# Patient Record
Sex: Male | Born: 1965 | Race: White | Hispanic: No | Marital: Married | Smoking: Never smoker
Health system: Southern US, Community
[De-identification: ages and names within clinical notes are randomized; demographics above are authoritative.]

## PROBLEM LIST (undated history)

## (undated) DIAGNOSIS — K219 Gastro-esophageal reflux disease without esophagitis: Secondary | ICD-10-CM

## (undated) DIAGNOSIS — E785 Hyperlipidemia, unspecified: Secondary | ICD-10-CM

## (undated) HISTORY — PX: KNEE ARTHROSCOPY: SUR90

---

## 2006-05-01 ENCOUNTER — Emergency Department (HOSPITAL_COMMUNITY): Admission: EM | Admit: 2006-05-01 | Discharge: 2006-05-01 | Payer: Self-pay | Admitting: Emergency Medicine

## 2014-05-18 ENCOUNTER — Other Ambulatory Visit: Payer: Self-pay | Admitting: Orthopedic Surgery

## 2014-05-18 DIAGNOSIS — M542 Cervicalgia: Secondary | ICD-10-CM

## 2014-05-18 DIAGNOSIS — M25511 Pain in right shoulder: Secondary | ICD-10-CM

## 2014-05-23 ENCOUNTER — Ambulatory Visit
Admission: RE | Admit: 2014-05-23 | Discharge: 2014-05-23 | Disposition: A | Discharge status: Home / Self Care | Payer: BC Managed Care – PPO | Source: Ambulatory Visit | Attending: Orthopedic Surgery | Admitting: Orthopedic Surgery

## 2014-05-23 DIAGNOSIS — M25511 Pain in right shoulder: Secondary | ICD-10-CM

## 2014-05-23 DIAGNOSIS — M542 Cervicalgia: Secondary | ICD-10-CM

## 2015-12-14 ENCOUNTER — Other Ambulatory Visit: Payer: Self-pay | Admitting: Cardiology

## 2015-12-14 ENCOUNTER — Encounter (HOSPITAL_COMMUNITY): Payer: Self-pay | Admitting: Cardiology

## 2015-12-14 ENCOUNTER — Encounter: Payer: Self-pay | Admitting: Cardiology

## 2015-12-14 DIAGNOSIS — IMO0001 Reserved for inherently not codable concepts without codable children: Secondary | ICD-10-CM | POA: Diagnosis present

## 2015-12-14 DIAGNOSIS — R030 Elevated blood-pressure reading, without diagnosis of hypertension: Secondary | ICD-10-CM

## 2015-12-14 DIAGNOSIS — I200 Unstable angina: Secondary | ICD-10-CM | POA: Diagnosis present

## 2015-12-14 DIAGNOSIS — R9430 Abnormal result of cardiovascular function study, unspecified: Secondary | ICD-10-CM

## 2015-12-14 DIAGNOSIS — E785 Hyperlipidemia, unspecified: Secondary | ICD-10-CM | POA: Diagnosis present

## 2015-12-14 NOTE — Progress Notes (Signed)
Patient ID: Adam Taylor, male   DOB: Jan 14, 1966, 50 y.o.   MRN: [id number]   Adam, Taylor    Date of visit:  12/14/2015 DOB:  07-11-1966    Age:  49 yrs. Medical record number:  [id number]     Account number:  [id number] ____________________________ CURRENT DIAGNOSES  1. Chest pain  2. Abnormal result of cardiovascular function study, unspecified  3. Hyperlipidemia  4. Elevated blood-pressure reading, without diagnosis of hypertension  5. Overweight ____________________________ ALLERGIES  No Known Allergies ____________________________ MEDICATIONS  1. omeprazole 20 mg capsule,delayed release, PRN  2. Cialis 5 mg tablet, PRN  3. nitroglycerin 0.4 mg sublingual tablet, Take as directed  4. aspirin 81 mg chewable tablet, 1 p.o. daily  5. atorvastatin 40 mg tablet, 1 p.o. daily  6. metoprolol succinate ER 50 mg tablet,extended release 24 hr, 1 p.o. daily ____________________________ CHIEF COMPLAINTS Chest pain ____________________________ HISTORY OF PRESENT ILLNESS This 50 year old male is seen as a new patient for evaluation of chest discomfort. About 2 weeks ago he was walking on a treadmill and had some midsternal burning type pain that started while he was running on the treadmill. He slowed down to a fast walking pace and was able to complete 40 minutes on the treadmill. 3 days ago while taking the trash cans down the street he had the onset of midsternal burning pain that intensified and he was able to come back up to the house and felt somewhat weak. He may have had some mild sweating with the discomfort. He had exertional burning type chest discomfort began this morning that again was associated with sweating and some shortness of breath and may have had a nagging sensation since then. He does not have any history of hyperlipidemia or family history of heart disease. He has been turned down for blood donation in the past because of elevated blood pressure. He was able to donate  blood 3 weeks ago without difficulty. He does not currently have a primary care physician. He does note some episodic indigestion and will use Prilosec OTC for this but has not had exertional type symptoms previously. He does have mild erectile dysfunction that he will use Cialis for intermittently. He has lost about 40 pounds in the past year. Does have some situational stress going on for the past 2 weeks.  Since he was seen he had recurrent chest discomfort yesterday while walking up a hill and also had chest discomfort while walking quickly to [place] on the 16th. He returned for a treadmill test later on the 16th and had 3 mm of ST segment depression that was flat and anterior leads at the end of stage I of the Bruce protocol. The test was terminated due to ST depression and he then began as some vague chest tightness that resolved quickly. He has not had any rest chest pain. After the treadmill test I talked about hospitalizing him but he did not wish to go into the hospital immediately that day. He was willing to have cardiac catheterization I discussed with him and made arrangements for him to have this done the next day. He was started on metoprolol and high-dose atorvastatin and was given instructions that he should go to emergency room. for recurrent pain and that he should not be involved in any activity. He has been on aspirin and was given nitroglycerin to take as needed when he was seen earlier in the week. ____________________________ PAST HISTORY  Past Medical Illnesses:  overweight, denies hypertension or  diabetes;  Cardiovascular Illnesses:  no previous history of cardiac disease.;  Surgical Procedures:  knee surgery, left;  NYHA Classification:  I;  Canadian Angina Classification:  Class 0: Asymptomatic;  Cardiology Procedures-Invasive:  no history of prior cardiac procedures;  Cardiology Procedures-Noninvasive:  no previous non-invasive procedures;  LVEF not documented,    ____________________________ CARDIO-PULMONARY TEST DATES EKG Date:  12/11/2015;   ____________________________ FAMILY HISTORY Brother -- Brother alive and well Brother -- Brother alive and well Father -- Father dead, Malignant melanoma, Prostate cancer Mother -- Mother alive and well ____________________________ SOCIAL HISTORY Alcohol Use:  socially;  Smoking:  never smoked;  Diet:  regular diet;  Lifestyle:  married;  Exercise:  some exercise;  Occupation:  Statistician;  Residence:  lives with wife;   ____________________________ REVIEW OF SYSTEMS General:  weight loss of approximately 40 lbs  Integumentary:no rashes or new skin lesions. Eyes: denies diplopia, history of glaucoma or visual problems. Respiratory: denies dyspnea, cough, wheezing or hemoptysis. Cardiovascular:  please review HPI Abdominal: dyspepsia Genitourinary-Male: no dysuria, urgency, frequency, or nocturia  Musculoskeletal:  denies arthritis, venous insufficiency, or muscle weakness Neurological:  denies headaches, stroke, or TIA Psychiatric:  situational stress  ____________________________ PHYSICAL EXAMINATION VITAL SIGNS  Blood Pressure:  140/88 Sitting, Left arm, regular cuff   Pulse:  96/min. Weight:  218.00 lbs. Height:  72"BMI: 29  Constitutional:  pleasant white male in no acute distress Skin:  warm and dry to touch, no apparent skin lesions, or masses noted. Head:  normocephalic, normal hair pattern, no masses or tenderness Eyes:  EOMS Intact, PERRLA, C and S clear, Funduscopic exam not done. ENT:  ears, nose and throat reveal no gross abnormalities.  Dentition good. Neck:  supple, without massess. No JVD, thyromegaly or carotid bruits. Carotid upstroke normal. Chest:  normal symmetry, clear to auscultation. Cardiac:  regular rhythm, normal S1 and S2, No S3 or S4, no murmurs, gallops or rubs detected. Abdomen:  abdomen soft,non-tender, no masses, no hepatospenomegaly, or aneurysm  noted Peripheral Pulses:  the femoral,dorsalis pedis, and posterior tibial pulses are full and equal bilaterally with no bruits auscultated. Extremities & Back:  no deformities, clubbing, cyanosis, erythema or edema observed. Normal muscle strength and tone. Neurological:  no gross motor or sensory deficits noted, affect appropriate, oriented x3. ____________________________ MOST RECENT LIPID PANEL 12/11/15  CHOL TOTL 241 mg/dL, TRIGLYCER 97 mg/dL, HDL 47 mg/dL, CHOL/HDL 5.1 Ratio, 629528 19 mg/dL and LDL 413 mg/dL ____________________________ IMPRESSIONS/PLAN  1. Chest pain consistent with new onset and progressive angina with early positive exercise treadmill test 2. Hyperlipidemia recently diagnosed 3. Elevated blood pressure without prior diagnosis of hypertension 4. Overweight  Recommendations:  He will be brought in for same-day catheterization. Cardiac catheterization was discussed with the patient including risks of myocardial infarction, death, stroke, bleeding, arrhythmia, dye allergy, or renal insufficiency. He understands and is willing to proceed. Possibility of percutaneous intervention at the same setting was also discussed with the patient including risks. Possibility of stenting was also discussed with him including being through the radial approach. This will be done by one of the physicians with [facility].  In the meantime he is started on metoprolol succinate 50 mg daily and is to begin atorvastatin 80 mg tonight and then 40 mg daily. Earlier in the week he was diagnosed with hyperlipidemia as noted below. ____________________________ Cardiology Physician:  Darden Palmer MD [facility]

## 2015-12-15 ENCOUNTER — Encounter (HOSPITAL_COMMUNITY)
Admission: AD | Disposition: A | Discharge status: Home / Self Care | Payer: Self-pay | Source: Ambulatory Visit | Attending: Cardiothoracic Surgery

## 2015-12-15 ENCOUNTER — Encounter (HOSPITAL_COMMUNITY): Payer: Self-pay | Admitting: Cardiology

## 2015-12-15 ENCOUNTER — Inpatient Hospital Stay (HOSPITAL_COMMUNITY): Payer: BC Managed Care – PPO

## 2015-12-15 ENCOUNTER — Inpatient Hospital Stay (HOSPITAL_COMMUNITY)
Admission: AD | Admit: 2015-12-15 | Discharge: 2015-12-20 | DRG: 234 | Disposition: A | Discharge status: Home / Self Care | Payer: BC Managed Care – PPO | Source: Ambulatory Visit | Attending: Cardiothoracic Surgery | Admitting: Cardiothoracic Surgery

## 2015-12-15 DIAGNOSIS — R9430 Abnormal result of cardiovascular function study, unspecified: Secondary | ICD-10-CM

## 2015-12-15 DIAGNOSIS — N529 Male erectile dysfunction, unspecified: Secondary | ICD-10-CM | POA: Diagnosis present

## 2015-12-15 DIAGNOSIS — Z8042 Family history of malignant neoplasm of prostate: Secondary | ICD-10-CM | POA: Diagnosis not present

## 2015-12-15 DIAGNOSIS — I25110 Atherosclerotic heart disease of native coronary artery with unstable angina pectoris: Principal | ICD-10-CM

## 2015-12-15 DIAGNOSIS — I2510 Atherosclerotic heart disease of native coronary artery without angina pectoris: Secondary | ICD-10-CM

## 2015-12-15 DIAGNOSIS — E663 Overweight: Secondary | ICD-10-CM | POA: Diagnosis present

## 2015-12-15 DIAGNOSIS — IMO0001 Reserved for inherently not codable concepts without codable children: Secondary | ICD-10-CM | POA: Diagnosis present

## 2015-12-15 DIAGNOSIS — I10 Essential (primary) hypertension: Secondary | ICD-10-CM | POA: Diagnosis present

## 2015-12-15 DIAGNOSIS — E785 Hyperlipidemia, unspecified: Secondary | ICD-10-CM | POA: Diagnosis not present

## 2015-12-15 DIAGNOSIS — Z808 Family history of malignant neoplasm of other organs or systems: Secondary | ICD-10-CM

## 2015-12-15 DIAGNOSIS — R079 Chest pain, unspecified: Secondary | ICD-10-CM

## 2015-12-15 DIAGNOSIS — Z6830 Body mass index (BMI) 30.0-30.9, adult: Secondary | ICD-10-CM

## 2015-12-15 DIAGNOSIS — I4510 Unspecified right bundle-branch block: Secondary | ICD-10-CM | POA: Diagnosis not present

## 2015-12-15 DIAGNOSIS — G4730 Sleep apnea, unspecified: Secondary | ICD-10-CM | POA: Diagnosis present

## 2015-12-15 DIAGNOSIS — E872 Acidosis: Secondary | ICD-10-CM | POA: Diagnosis not present

## 2015-12-15 DIAGNOSIS — Z951 Presence of aortocoronary bypass graft: Secondary | ICD-10-CM

## 2015-12-15 DIAGNOSIS — R030 Elevated blood-pressure reading, without diagnosis of hypertension: Secondary | ICD-10-CM

## 2015-12-15 DIAGNOSIS — I200 Unstable angina: Secondary | ICD-10-CM | POA: Diagnosis present

## 2015-12-15 HISTORY — DX: Gastro-esophageal reflux disease without esophagitis: K21.9

## 2015-12-15 HISTORY — PX: CARDIAC CATHETERIZATION: SHX172

## 2015-12-15 HISTORY — DX: Hyperlipidemia, unspecified: E78.5

## 2015-12-15 LAB — BASIC METABOLIC PANEL
Anion gap: 5 (ref 5–15)
BUN: 12 mg/dL (ref 6–20)
CO2: 27 mmol/L (ref 22–32)
Calcium: 9 mg/dL (ref 8.9–10.3)
Chloride: 108 mmol/L (ref 101–111)
Creatinine, Ser: 1.18 mg/dL (ref 0.61–1.24)
GFR calc Af Amer: >60 mL/min (ref >60)
GFR calc non Af Amer: >60 mL/min (ref >60)
Glucose, Bld: 90 mg/dL (ref 65–99)
Potassium: 4.2 mmol/L (ref 3.5–5.1)
Sodium: 140 mmol/L (ref 135–145)

## 2015-12-15 LAB — ABO/RH: ABO/RH(D): A POS

## 2015-12-15 LAB — POCT I-STAT 3, ART BLOOD GAS (G3+)
BICARBONATE: 24 meq/L (ref 20.0–24.0)
O2 SAT: 96 %
PCO2 ART: 37.3 mmHg (ref 35.0–45.0)
PO2 ART: 76 mmHg — AB (ref 80.0–100.0)
TCO2: 25 mmol/L (ref 0–100)
pH, Arterial: 7.415 (ref 7.350–7.450)

## 2015-12-15 LAB — CBC
HEMATOCRIT: 40.9 % (ref 39.0–52.0)
HEMOGLOBIN: 13.9 g/dL (ref 13.0–17.0)
MCH: 28 pg (ref 26.0–34.0)
MCHC: 34 g/dL (ref 30.0–36.0)
MCV: 82.3 fL (ref 78.0–100.0)
Platelets: 262 10*3/uL (ref 150–400)
RBC: 4.97 MIL/uL (ref 4.22–5.81)
RDW: 12.6 % (ref 11.5–15.5)
WBC: 6 10*3/uL (ref 4.0–10.5)

## 2015-12-15 LAB — PROTIME-INR
INR: 1.07 (ref 0.00–1.49)
PROTHROMBIN TIME: 14.1 s (ref 11.6–15.2)

## 2015-12-15 LAB — TYPE AND SCREEN
ABO/RH(D): A POS
Antibody Screen: NEGATIVE

## 2015-12-15 LAB — SURGICAL PCR SCREEN
MRSA, PCR: NEGATIVE
Staphylococcus aureus: NEGATIVE

## 2015-12-15 SURGERY — LEFT HEART CATH AND CORONARY ANGIOGRAPHY
Anesthesia: LOCAL

## 2015-12-15 MED ORDER — SODIUM CHLORIDE 0.9 % IV SOLN: 250.0000 mL | INTRAVENOUS | Status: DC | PRN

## 2015-12-15 MED ORDER — CHLORHEXIDINE GLUCONATE 0.12 % MT SOLN
15.0000 mL | Freq: Once | OROMUCOSAL | Status: AC
  Administered 2015-12-16: 15 mL via OROMUCOSAL
  Filled 2015-12-15: qty 15

## 2015-12-15 MED ORDER — SODIUM CHLORIDE 0.9 % IV SOLN
INTRAVENOUS | Status: DC
  Filled 2015-12-15: qty 2.5

## 2015-12-15 MED ORDER — NITROGLYCERIN IN D5W 200-5 MCG/ML-% IV SOLN
3.0000 ug/min | INTRAVENOUS | Status: DC
  Administered 2015-12-16: 5 ug/min via INTRAVENOUS

## 2015-12-15 MED ORDER — MIDAZOLAM HCL 2 MG/2ML IJ SOLN
INTRAMUSCULAR | Status: AC
  Filled 2015-12-15: qty 2

## 2015-12-15 MED ORDER — HEPARIN (PORCINE) IN NACL 100-0.45 UNIT/ML-% IJ SOLN
1200.0000 [IU]/h | INTRAMUSCULAR | Status: DC
  Administered 2015-12-16: 1200 [IU]/h via INTRAVENOUS
  Filled 2015-12-15 (×2): qty 250

## 2015-12-15 MED ORDER — SODIUM CHLORIDE 0.9% FLUSH: 3.0000 mL | INTRAVENOUS | Status: DC | PRN

## 2015-12-15 MED ORDER — DEXMEDETOMIDINE HCL IN NACL 400 MCG/100ML IV SOLN
0.1000 ug/kg/h | INTRAVENOUS | Status: DC
Start: 2015-12-16 — End: 2015-12-16
  Filled 2015-12-15: qty 100

## 2015-12-15 MED ORDER — IOHEXOL 350 MG/ML SOLN
INTRAVENOUS | Status: DC | PRN
  Administered 2015-12-15: 60 mL via INTRA_ARTERIAL

## 2015-12-15 MED ORDER — METOPROLOL TARTRATE 12.5 MG HALF TABLET
12.5000 mg | ORAL_TABLET | Freq: Once | ORAL | Status: AC
  Administered 2015-12-16: 12.5 mg via ORAL
  Filled 2015-12-15: qty 1

## 2015-12-15 MED ORDER — SODIUM CHLORIDE 0.9 % WEIGHT BASED INFUSION: 1.0000 mL/kg/h | INTRAVENOUS | Status: DC

## 2015-12-15 MED ORDER — CHLORHEXIDINE GLUCONATE 4 % EX LIQD
60.0000 mL | Freq: Once | CUTANEOUS | Status: AC
  Administered 2015-12-16: 4 via TOPICAL
  Filled 2015-12-15: qty 60

## 2015-12-15 MED ORDER — ACETAMINOPHEN 325 MG PO TABS: 650.0000 mg | ORAL_TABLET | ORAL | Status: DC | PRN

## 2015-12-15 MED ORDER — DOPAMINE-DEXTROSE 3.2-5 MG/ML-% IV SOLN
0.0000 ug/kg/min | INTRAVENOUS | Status: DC
  Filled 2015-12-15: qty 250

## 2015-12-15 MED ORDER — SODIUM CHLORIDE 0.9% FLUSH
3.0000 mL | Freq: Two times a day (BID) | INTRAVENOUS | Status: DC
  Administered 2015-12-15: 3 mL via INTRAVENOUS

## 2015-12-15 MED ORDER — HEPARIN SODIUM (PORCINE) 1000 UNIT/ML IJ SOLN
INTRAMUSCULAR | Status: DC | PRN
Start: 2015-12-15 — End: 2015-12-15
  Administered 2015-12-15: 5000 [IU] via INTRAVENOUS

## 2015-12-15 MED ORDER — FENTANYL CITRATE (PF) 100 MCG/2ML IJ SOLN
INTRAMUSCULAR | Status: AC
  Filled 2015-12-15: qty 2

## 2015-12-15 MED ORDER — SODIUM CHLORIDE 0.9% FLUSH: 3.0000 mL | Freq: Two times a day (BID) | INTRAVENOUS | Status: DC

## 2015-12-15 MED ORDER — LIDOCAINE HCL (PF) 1 % IJ SOLN
INTRAMUSCULAR | Status: DC | PRN
  Administered 2015-12-15: 14:00:00

## 2015-12-15 MED ORDER — SODIUM CHLORIDE 0.9 % IV SOLN
INTRAVENOUS | Status: DC
  Filled 2015-12-15: qty 40

## 2015-12-15 MED ORDER — ASPIRIN 81 MG PO CHEW
CHEWABLE_TABLET | ORAL | Status: AC
  Administered 2015-12-15: 81 mg via ORAL
  Filled 2015-12-15: qty 1

## 2015-12-15 MED ORDER — POTASSIUM CHLORIDE 2 MEQ/ML IV SOLN
80.0000 meq | INTRAVENOUS | Status: DC
  Filled 2015-12-15: qty 40

## 2015-12-15 MED ORDER — ONDANSETRON HCL 4 MG/2ML IJ SOLN: 4.0000 mg | Freq: Four times a day (QID) | INTRAMUSCULAR | Status: DC | PRN

## 2015-12-15 MED ORDER — DEXTROSE 5 % IV SOLN
30.0000 ug/min | INTRAVENOUS | Status: DC
  Filled 2015-12-15: qty 2

## 2015-12-15 MED ORDER — ASPIRIN 81 MG PO CHEW
81.0000 mg | CHEWABLE_TABLET | ORAL | Status: AC
  Administered 2015-12-15: 81 mg via ORAL

## 2015-12-15 MED ORDER — PLASMA-LYTE 148 IV SOLN
INTRAVENOUS | Status: DC
  Filled 2015-12-15: qty 2.5

## 2015-12-15 MED ORDER — DEXTROSE 5 % IV SOLN
750.0000 mg | INTRAVENOUS | Status: DC
  Filled 2015-12-15: qty 750

## 2015-12-15 MED ORDER — CHLORHEXIDINE GLUCONATE 4 % EX LIQD
60.0000 mL | Freq: Once | CUTANEOUS | Status: AC
  Administered 2015-12-15: 4 via TOPICAL
  Filled 2015-12-15: qty 60

## 2015-12-15 MED ORDER — MAGNESIUM SULFATE 50 % IJ SOLN
40.0000 meq | INTRAMUSCULAR | Status: DC
  Filled 2015-12-15: qty 10

## 2015-12-15 MED ORDER — SODIUM CHLORIDE 0.9 % IV SOLN
INTRAVENOUS | Status: DC
  Filled 2015-12-15: qty 30

## 2015-12-15 MED ORDER — HEPARIN (PORCINE) IN NACL 2-0.9 UNIT/ML-% IJ SOLN
INTRAMUSCULAR | Status: AC
  Filled 2015-12-15: qty 1000

## 2015-12-15 MED ORDER — HEPARIN (PORCINE) IN NACL 2-0.9 UNIT/ML-% IJ SOLN
INTRAMUSCULAR | Status: DC | PRN
  Administered 2015-12-15: 14:00:00 via INTRA_ARTERIAL

## 2015-12-15 MED ORDER — VERAPAMIL HCL 2.5 MG/ML IV SOLN
INTRAVENOUS | Status: AC
  Filled 2015-12-15: qty 2

## 2015-12-15 MED ORDER — SODIUM CHLORIDE 0.9 % WEIGHT BASED INFUSION
3.0000 mL/kg/h | INTRAVENOUS | Status: DC
  Administered 2015-12-15: 3 mL/kg/h via INTRAVENOUS

## 2015-12-15 MED ORDER — FENTANYL CITRATE (PF) 100 MCG/2ML IJ SOLN
INTRAMUSCULAR | Status: DC | PRN
  Administered 2015-12-15: 25 ug via INTRAVENOUS

## 2015-12-15 MED ORDER — NITROGLYCERIN IN D5W 200-5 MCG/ML-% IV SOLN
2.0000 ug/min | INTRAVENOUS | Status: DC
  Filled 2015-12-15: qty 250

## 2015-12-15 MED ORDER — VANCOMYCIN HCL 10 G IV SOLR
1500.0000 mg | INTRAVENOUS | Status: DC
  Filled 2015-12-15: qty 1500

## 2015-12-15 MED ORDER — METOPROLOL TARTRATE 25 MG PO TABS: 25.0000 mg | ORAL_TABLET | Freq: Two times a day (BID) | ORAL | Status: DC

## 2015-12-15 MED ORDER — LIDOCAINE HCL (PF) 1 % IJ SOLN
INTRAMUSCULAR | Status: AC
  Filled 2015-12-15: qty 30

## 2015-12-15 MED ORDER — EPINEPHRINE HCL 1 MG/ML IJ SOLN
0.0000 ug/min | INTRAMUSCULAR | Status: DC
  Filled 2015-12-15: qty 4

## 2015-12-15 MED ORDER — DIAZEPAM 5 MG PO TABS
5.0000 mg | ORAL_TABLET | Freq: Once | ORAL | Status: AC
  Administered 2015-12-16: 5 mg via ORAL
  Filled 2015-12-15: qty 1

## 2015-12-15 MED ORDER — TEMAZEPAM 15 MG PO CAPS: 15.0000 mg | ORAL_CAPSULE | Freq: Once | ORAL | Status: DC | PRN

## 2015-12-15 MED ORDER — DIAZEPAM 5 MG PO TABS: 5.0000 mg | ORAL_TABLET | ORAL | Status: DC | PRN

## 2015-12-15 MED ORDER — BISACODYL 5 MG PO TBEC: 5.0000 mg | DELAYED_RELEASE_TABLET | Freq: Once | ORAL | Status: DC

## 2015-12-15 MED ORDER — HEPARIN SODIUM (PORCINE) 1000 UNIT/ML IJ SOLN
INTRAMUSCULAR | Status: AC
  Filled 2015-12-15: qty 1

## 2015-12-15 MED ORDER — DEXTROSE 5 % IV SOLN
1.5000 g | INTRAVENOUS | Status: DC
  Filled 2015-12-15: qty 1.5

## 2015-12-15 MED ORDER — ATORVASTATIN CALCIUM 80 MG PO TABS
80.0000 mg | ORAL_TABLET | Freq: Every day | ORAL | Status: DC
  Administered 2015-12-15 – 2015-12-19 (×4): 80 mg via ORAL
  Filled 2015-12-15 (×4): qty 1

## 2015-12-15 MED ORDER — SODIUM CHLORIDE 0.9 % WEIGHT BASED INFUSION
1.0000 mL/kg/h | INTRAVENOUS | Status: AC
  Administered 2015-12-15: 1 mL/kg/h via INTRAVENOUS

## 2015-12-15 MED ORDER — MIDAZOLAM HCL 2 MG/2ML IJ SOLN
INTRAMUSCULAR | Status: DC | PRN
  Administered 2015-12-15: 2 mg via INTRAVENOUS

## 2015-12-15 SURGICAL SUPPLY — 9 items
CATH IMPULSE 5F ANG/FL3.5 (CATHETERS) ×1 IMPLANT
DEVICE RAD COMP TR BAND LRG (VASCULAR PRODUCTS) ×2 IMPLANT
GLIDESHEATH SLEND SS 6F .021 (SHEATH) ×2 IMPLANT
KIT HEART LEFT (KITS) ×2 IMPLANT
PACK CARDIAC CATHETERIZATION (CUSTOM PROCEDURE TRAY) ×2 IMPLANT
SYR MEDRAD MARK V 150ML (SYRINGE) ×2 IMPLANT
TRANSDUCER W/STOPCOCK (MISCELLANEOUS) ×2 IMPLANT
TUBING CIL FLEX 10 FLL-RA (TUBING) ×2 IMPLANT
WIRE SAFE-T 1.5MM-J .035X260CM (WIRE) ×2 IMPLANT

## 2015-12-15 NOTE — Progress Notes (Signed)
VASCULAR LAB PRELIMINARY  PRELIMINARY  PRELIMINARY  PRELIMINARY  Pre-op Cardiac Surgery  Carotid Findings:  Bilateral:  1-39% ICA stenosis.  Vertebral artery flow is antegrade.      Upper Extremity Right Left  Brachial Pressures 125  Triphasic  119  Triphasic   Radial Waveforms Triphasic  Triphasic   Ulnar Waveforms Triphasic  Triphasic   Palmar Arch (Allen's Test) Doppler obliterates with radial compression, normal with ulnar compression. Doppler normal with radial compression, obliterates with ulnar compression.     Lower  Extremity Right Left  Dorsalis Pedis Triphasic  Triphasic   Anterior Tibial    Posterior Tibial Triphasic  Triphasic   Ankle/Brachial Indices      Adam Taylor, RVT 12/15/2015, 8:06 PM

## 2015-12-15 NOTE — Progress Notes (Signed)
ANTICOAGULATION CONSULT NOTE - Initial Consult  Pharmacy Consult for heparin Indication: chest pain/ACS  No Known Allergies  Patient Measurements: Height: 6' (182.9 cm) Weight: 208 lb (94.348 kg) IBW/kg (Calculated) : 77.6 Heparin Dosing Weight: 94 kg  Vital Signs: Temp: 98.4 F (36.9 C) (02/17 1158) Temp Source: Oral (02/17 1158) BP: 153/89 mmHg (02/17 1412) Pulse Rate: 68 (02/17 1412)  Labs:  Recent Labs  12/15/15 1250  LABPROT 14.1  INR 1.07    CrCl cannot be calculated (Patient has no serum creatinine result on file.).   Medical History: Past Medical History  Diagnosis Date  . Hyperlipidemia   . GERD without esophagitis     Medications:  Prescriptions prior to admission  Medication Sig Dispense Refill Last Dose  . omeprazole (PRILOSEC) 20 MG capsule Take 20 mg by mouth daily as needed.   12/15/2015 at 0900  . nitroGLYCERIN (NITROSTAT) 0.4 MG SL tablet Place 0.4 mg under the tongue every 5 (five) minutes as needed for chest pain.    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    Goal of Therapy:  Heparin level 0.3-0.7 units/ml Monitor platelets by anticoagulation protocol: Yes   Plan:  -Heparin 1200 units/hr -Daily HL, CBC -Monitor s/sx bleeding    Baldemar Friday 12/15/2015,4:07 PM

## 2015-12-15 NOTE — Progress Notes (Signed)
Day of Surgery Procedure(s) (LRB): Left Heart Cath and Coronary Angiography (N/A) Subjective: Patient examined,cardiac cath reviewed Healthy 50 yo male with exertional angina, positive ETT Cath with distal L main 90%, RCA normal  no pain during cath  Objective: Vital signs in last 24 hours: Temp:  [98.4 F (36.9 C)] 98.4 F (36.9 C) (02/17 1158) Pulse Rate:  [59-126] 68 (02/17 1412) Cardiac Rhythm:  [-]  Resp:  [11-40] 15 (02/17 1412) BP: (128-156)/(75-94) 153/89 mmHg (02/17 1412) SpO2:  [97 %-100 %] 99 % (02/17 1412) Weight:  [208 lb (94.348 kg)] 208 lb (94.348 kg) (02/17 1158)  Hemodynamic parameters for last 24 hours:  nsr  Intake/Output from previous day:   Intake/Output this shift:        Physical Exam  General: healthy middle aged WM NAD HEENT: Normocephalic pupils equal , dentition adequate Neck: Supple without JVD, adenopathy, or bruit Chest: Clear to auscultation, symmetrical breath sounds, no rhonchi, no tenderness             or deformity Cardiovascular: Regular rate and rhythm, no murmur, no gallop, peripheral pulses             palpable in all extremities Abdomen:  Soft, nontender, no palpable mass or organomegaly Extremities: Warm, well-perfused, no clubbing cyanosis edema or tenderness, R radial compression device in place              no venous stasis changes of the legs Rectal/GU: Deferred Neuro: Grossly non--focal and symmetrical throughout Skin: Clean and dry without rash or ulceration   Lab Results: No results for input(s): WBC, HGB, HCT, PLT in the last 72 hours. BMET: No results for input(s): NA, K, CL, CO2, GLUCOSE, BUN, CREATININE, CALCIUM in the last 72 hours.  PT/INR:  Recent Labs  12/15/15 1250  LABPROT 14.1  INR 1.07   ABG No results found for: PHART, HCO3, TCO2, ACIDBASEDEF, O2SAT CBG (last 3)  No results for input(s): GLUCAP in the last 72 hours.  Assessment/Plan: S/P Procedure(s) (LRB): Left Heart Cath and Coronary  Angiography (N/A)  Plan CABG in am Procedure, benefits and risks d/w patient and wife   LOS: 0 days    Kathlee Nations Trigt III 12/15/2015

## 2015-12-15 NOTE — H&P (View-Only) (Signed)
Patient ID: Adam Taylor, male   DOB: Jan 14, 1966, 50 y.o.   MRN: [id number]   Adam, Taylor    Date of visit:  12/14/2015 DOB:  07-11-1966    Age:  49 yrs. Medical record number:  [id number]     Account number:  [id number] ____________________________ CURRENT DIAGNOSES  1. Chest pain  2. Abnormal result of cardiovascular function study, unspecified  3. Hyperlipidemia  4. Elevated blood-pressure reading, without diagnosis of hypertension  5. Overweight ____________________________ ALLERGIES  No Known Allergies ____________________________ MEDICATIONS  1. omeprazole 20 mg capsule,delayed release, PRN  2. Cialis 5 mg tablet, PRN  3. nitroglycerin 0.4 mg sublingual tablet, Take as directed  4. aspirin 81 mg chewable tablet, 1 p.o. daily  5. atorvastatin 40 mg tablet, 1 p.o. daily  6. metoprolol succinate ER 50 mg tablet,extended release 24 hr, 1 p.o. daily ____________________________ CHIEF COMPLAINTS Chest pain ____________________________ HISTORY OF PRESENT ILLNESS This 50 year old male is seen as a new patient for evaluation of chest discomfort. About 2 weeks ago he was walking on a treadmill and had some midsternal burning type pain that started while he was running on the treadmill. He slowed down to a fast walking pace and was able to complete 40 minutes on the treadmill. 3 days ago while taking the trash cans down the street he had the onset of midsternal burning pain that intensified and he was able to come back up to the house and felt somewhat weak. He may have had some mild sweating with the discomfort. He had exertional burning type chest discomfort began this morning that again was associated with sweating and some shortness of breath and may have had a nagging sensation since then. He does not have any history of hyperlipidemia or family history of heart disease. He has been turned down for blood donation in the past because of elevated blood pressure. He was able to donate  blood 3 weeks ago without difficulty. He does not currently have a primary care physician. He does note some episodic indigestion and will use Prilosec OTC for this but has not had exertional type symptoms previously. He does have mild erectile dysfunction that he will use Cialis for intermittently. He has lost about 40 pounds in the past year. Does have some situational stress going on for the past 2 weeks.  Since he was seen he had recurrent chest discomfort yesterday while walking up a hill and also had chest discomfort while walking quickly to [place] on the 16th. He returned for a treadmill test later on the 16th and had 3 mm of ST segment depression that was flat and anterior leads at the end of stage I of the Bruce protocol. The test was terminated due to ST depression and he then began as some vague chest tightness that resolved quickly. He has not had any rest chest pain. After the treadmill test I talked about hospitalizing him but he did not wish to go into the hospital immediately that day. He was willing to have cardiac catheterization I discussed with him and made arrangements for him to have this done the next day. He was started on metoprolol and high-dose atorvastatin and was given instructions that he should go to emergency room. for recurrent pain and that he should not be involved in any activity. He has been on aspirin and was given nitroglycerin to take as needed when he was seen earlier in the week. ____________________________ PAST HISTORY  Past Medical Illnesses:  overweight, denies hypertension or  diabetes;  Cardiovascular Illnesses:  no previous history of cardiac disease.;  Surgical Procedures:  knee surgery, left;  NYHA Classification:  I;  Canadian Angina Classification:  Class 0: Asymptomatic;  Cardiology Procedures-Invasive:  no history of prior cardiac procedures;  Cardiology Procedures-Noninvasive:  no previous non-invasive procedures;  LVEF not documented,    ____________________________ CARDIO-PULMONARY TEST DATES EKG Date:  12/11/2015;   ____________________________ FAMILY HISTORY Brother -- Brother alive and well Brother -- Brother alive and well Father -- Father dead, Malignant melanoma, Prostate cancer Mother -- Mother alive and well ____________________________ SOCIAL HISTORY Alcohol Use:  socially;  Smoking:  never smoked;  Diet:  regular diet;  Lifestyle:  married;  Exercise:  some exercise;  Occupation:  owner construction company;  Residence:  lives with wife;   ____________________________ REVIEW OF SYSTEMS General:  weight loss of approximately 40 lbs  Integumentary:no rashes or new skin lesions. Eyes: denies diplopia, history of glaucoma or visual problems. Respiratory: denies dyspnea, cough, wheezing or hemoptysis. Cardiovascular:  please review HPI Abdominal: dyspepsia Genitourinary-Male: no dysuria, urgency, frequency, or nocturia  Musculoskeletal:  denies arthritis, venous insufficiency, or muscle weakness Neurological:  denies headaches, stroke, or TIA Psychiatric:  situational stress  ____________________________ PHYSICAL EXAMINATION VITAL SIGNS  Blood Pressure:  140/88 Sitting, Left arm, regular cuff   Pulse:  96/min. Weight:  218.00 lbs. Height:  72"BMI: 29  Constitutional:  pleasant white male in no acute distress Skin:  warm and dry to touch, no apparent skin lesions, or masses noted. Head:  normocephalic, normal hair pattern, no masses or tenderness Eyes:  EOMS Intact, PERRLA, C and S clear, Funduscopic exam not done. ENT:  ears, nose and throat reveal no gross abnormalities.  Dentition good. Neck:  supple, without massess. No JVD, thyromegaly or carotid bruits. Carotid upstroke normal. Chest:  normal symmetry, clear to auscultation. Cardiac:  regular rhythm, normal S1 and S2, No S3 or S4, no murmurs, gallops or rubs detected. Abdomen:  abdomen soft,non-tender, no masses, no hepatospenomegaly, or aneurysm  noted Peripheral Pulses:  the femoral,dorsalis pedis, and posterior tibial pulses are full and equal bilaterally with no bruits auscultated. Extremities & Back:  no deformities, clubbing, cyanosis, erythema or edema observed. Normal muscle strength and tone. Neurological:  no gross motor or sensory deficits noted, affect appropriate, oriented x3. ____________________________ MOST RECENT LIPID PANEL 12/11/15  CHOL TOTL 241 mg/dL, TRIGLYCER 97 mg/dL, HDL 47 mg/dL, CHOL/HDL 5.1 Ratio, 230315 19 mg/dL and LDL 175 mg/dL ____________________________ IMPRESSIONS/PLAN  1. Chest pain consistent with new onset and progressive angina with early positive exercise treadmill test 2. Hyperlipidemia recently diagnosed 3. Elevated blood pressure without prior diagnosis of hypertension 4. Overweight  Recommendations:  He will be brought in for same-day catheterization. Cardiac catheterization was discussed with the patient including risks of myocardial infarction, death, stroke, bleeding, arrhythmia, dye allergy, or renal insufficiency. He understands and is willing to proceed. Possibility of percutaneous intervention at the same setting was also discussed with the patient including risks. Possibility of stenting was also discussed with him including being through the radial approach. This will be done by one of the physicians with [facility].  In the meantime he is started on metoprolol succinate 50 mg daily and is to begin atorvastatin 80 mg tonight and then 40 mg daily. Earlier in the week he was diagnosed with hyperlipidemia as noted below. ____________________________ Cardiology Physician:  W. Spencer Tilley, Jr. MD [facility]    

## 2015-12-15 NOTE — Progress Notes (Signed)
ABG collected on ROOM AIR. Pre-cabg abg

## 2015-12-15 NOTE — Progress Notes (Signed)
Utilization Review Completed.Dorcas Carrow T2/17/2017

## 2015-12-15 NOTE — Progress Notes (Signed)
Patient was brought in for same-day cardiac catheterization.  He was seen in the office earlier this week with new onset of exertional angina and had a very strongly positive exercise treadmill test which is brought over into the chart with 3 mm of ST depression in the anterior and inferior leads into Bruce stage I.  Catheterization films were reviewed and show severe 99% ostial left main disease.  The right coronary artery has scattered irregularities.  He is currently pain-free.  Bypass grafting is planned for the morning.  He is on beta blockers and was started on atorvastatin yesterday.  Questions were answered regarding the surgery.  Appreciate help of cardiac surgery as well as Dr. [place].  Darden Palmer MD [facility]

## 2015-12-15 NOTE — Interval H&P Note (Signed)
History and Physical Interval Note:  12/15/2015 1:33 PM  Adam Taylor  has presented today for surgery, with the diagnosis of unstable angina  The various methods of treatment have been discussed with the patient and family. After consideration of risks, benefits and other options for treatment, the patient has consented to  Procedure(s): Left Heart Cath and Coronary Angiography (N/A) as a surgical intervention .  The patient's history has been reviewed, patient examined, no change in status, stable for surgery.  I have reviewed the patient's chart and labs.  Questions were answered to the patient's satisfaction.   Cath Lab Visit (complete for each Cath Lab visit)  Clinical Evaluation Leading to the Procedure:   ACS: No.  Non-ACS:    Anginal Classification: CCS III  Anti-ischemic medical therapy: No Therapy  Non-Invasive Test Results: High-risk stress test findings: cardiac mortality >3%/year  Prior CABG: No previous CABG        Theron Arista [facility] 12/15/2015 1:33 PM

## 2015-12-16 ENCOUNTER — Inpatient Hospital Stay (HOSPITAL_COMMUNITY): Payer: BC Managed Care – PPO | Admitting: Certified Registered Nurse Anesthetist

## 2015-12-16 ENCOUNTER — Inpatient Hospital Stay (HOSPITAL_COMMUNITY): Payer: BC Managed Care – PPO

## 2015-12-16 ENCOUNTER — Encounter (HOSPITAL_COMMUNITY)
Admission: AD | Disposition: A | Discharge status: Home / Self Care | Payer: Self-pay | Source: Ambulatory Visit | Attending: Cardiothoracic Surgery

## 2015-12-16 DIAGNOSIS — Z951 Presence of aortocoronary bypass graft: Secondary | ICD-10-CM

## 2015-12-16 HISTORY — PX: INTRAOPERATIVE TRANSESOPHAGEAL ECHOCARDIOGRAM: SHX5062

## 2015-12-16 HISTORY — PX: CORONARY ARTERY BYPASS GRAFT: SHX141

## 2015-12-16 LAB — CREATININE, SERUM
Creatinine, Ser: 1.06 mg/dL (ref 0.61–1.24)
GFR calc Af Amer: >60 mL/min (ref >60)
GFR calc non Af Amer: >60 mL/min (ref >60)

## 2015-12-16 LAB — POCT I-STAT, CHEM 8
BUN: 11 mg/dL (ref 6–20)
BUN: 9 mg/dL (ref 6–20)
BUN: 9 mg/dL (ref 6–20)
BUN: 9 mg/dL (ref 6–20)
BUN: 9 mg/dL (ref 6–20)
BUN: 8 mg/dL (ref 6–20)
CALCIUM ION: 1.19 mmol/L (ref 1.12–1.23)
CALCIUM ION: 1.18 mmol/L (ref 1.12–1.23)
CALCIUM ION: 0.99 mmol/L — AB (ref 1.12–1.23)
CALCIUM ION: 0.99 mmol/L — AB (ref 1.12–1.23)
CALCIUM ION: 1.05 mmol/L — AB (ref 1.12–1.23)
CHLORIDE: 104 mmol/L (ref 101–111)
CHLORIDE: 108 mmol/L (ref 101–111)
CREATININE: 0.7 mg/dL (ref 0.61–1.24)
CREATININE: 0.7 mg/dL (ref 0.61–1.24)
CREATININE: 0.9 mg/dL (ref 0.61–1.24)
CREATININE: 0.8 mg/dL (ref 0.61–1.24)
Calcium, Ion: 1.02 mmol/L — ABNORMAL LOW (ref 1.12–1.23)
Chloride: 104 mmol/L (ref 101–111)
Chloride: 99 mmol/L — ABNORMAL LOW (ref 101–111)
Chloride: 100 mmol/L — ABNORMAL LOW (ref 101–111)
Chloride: 100 mmol/L — ABNORMAL LOW (ref 101–111)
Creatinine, Ser: 0.8 mg/dL (ref 0.61–1.24)
Creatinine, Ser: 0.9 mg/dL (ref 0.61–1.24)
GLUCOSE: 89 mg/dL (ref 65–99)
GLUCOSE: 104 mg/dL — AB (ref 65–99)
GLUCOSE: 148 mg/dL — AB (ref 65–99)
GLUCOSE: 156 mg/dL — AB (ref 65–99)
Glucose, Bld: 91 mg/dL (ref 65–99)
Glucose, Bld: 94 mg/dL (ref 65–99)
HCT: 36 % — ABNORMAL LOW (ref 39.0–52.0)
HCT: 26 % — ABNORMAL LOW (ref 39.0–52.0)
HCT: 26 % — ABNORMAL LOW (ref 39.0–52.0)
HCT: 27 % — ABNORMAL LOW (ref 39.0–52.0)
HEMATOCRIT: 34 % — AB (ref 39.0–52.0)
HEMATOCRIT: 26 % — AB (ref 39.0–52.0)
HEMOGLOBIN: 8.8 g/dL — AB (ref 13.0–17.0)
HEMOGLOBIN: 8.8 g/dL — AB (ref 13.0–17.0)
HEMOGLOBIN: 8.8 g/dL — AB (ref 13.0–17.0)
HEMOGLOBIN: 9.2 g/dL — AB (ref 13.0–17.0)
Hemoglobin: 12.2 g/dL — ABNORMAL LOW (ref 13.0–17.0)
Hemoglobin: 11.6 g/dL — ABNORMAL LOW (ref 13.0–17.0)
POTASSIUM: 4 mmol/L (ref 3.5–5.1)
POTASSIUM: 4.7 mmol/L (ref 3.5–5.1)
Potassium: 4 mmol/L (ref 3.5–5.1)
Potassium: 4.2 mmol/L (ref 3.5–5.1)
Potassium: 4.8 mmol/L (ref 3.5–5.1)
Potassium: 4 mmol/L (ref 3.5–5.1)
SODIUM: 139 mmol/L (ref 135–145)
SODIUM: 135 mmol/L (ref 135–145)
Sodium: 136 mmol/L (ref 135–145)
Sodium: 139 mmol/L (ref 135–145)
Sodium: 136 mmol/L (ref 135–145)
Sodium: 135 mmol/L (ref 135–145)
TCO2: 27 mmol/L (ref 0–100)
TCO2: 25 mmol/L (ref 0–100)
TCO2: 26 mmol/L (ref 0–100)
TCO2: 26 mmol/L (ref 0–100)
TCO2: 25 mmol/L (ref 0–100)
TCO2: 24 mmol/L (ref 0–100)

## 2015-12-16 LAB — BASIC METABOLIC PANEL
Anion gap: 9 (ref 5–15)
BUN: 12 mg/dL (ref 6–20)
CO2: 21 mmol/L — ABNORMAL LOW (ref 22–32)
Calcium: 8.8 mg/dL — ABNORMAL LOW (ref 8.9–10.3)
Chloride: 108 mmol/L (ref 101–111)
Creatinine, Ser: 1.15 mg/dL (ref 0.61–1.24)
GFR calc Af Amer: >60 mL/min (ref >60)
GFR calc non Af Amer: >60 mL/min (ref >60)
Glucose, Bld: 86 mg/dL (ref 65–99)
Potassium: 4.1 mmol/L (ref 3.5–5.1)
Sodium: 138 mmol/L (ref 135–145)

## 2015-12-16 LAB — POCT I-STAT 3, ART BLOOD GAS (G3+)
ACID-BASE DEFICIT: 2 mmol/L (ref 0.0–2.0)
ACID-BASE EXCESS: 1 mmol/L (ref 0.0–2.0)
Acid-base deficit: 3 mmol/L — ABNORMAL HIGH (ref 0.0–2.0)
Acid-base deficit: 5 mmol/L — ABNORMAL HIGH (ref 0.0–2.0)
Acid-base deficit: 5 mmol/L — ABNORMAL HIGH (ref 0.0–2.0)
BICARBONATE: 23.2 meq/L (ref 20.0–24.0)
Bicarbonate: 25 mEq/L — ABNORMAL HIGH (ref 20.0–24.0)
Bicarbonate: 24.5 mEq/L — ABNORMAL HIGH (ref 20.0–24.0)
Bicarbonate: 19.6 mEq/L — ABNORMAL LOW (ref 20.0–24.0)
Bicarbonate: 22.2 meq/L (ref 20.0–24.0)
O2 SAT: 100 %
O2 Saturation: 100 %
O2 Saturation: 98 %
O2 Saturation: 99 %
O2 Saturation: 90 %
PCO2 ART: 54.6 mmHg — AB (ref 35.0–45.0)
PH ART: 7.425 (ref 7.350–7.450)
PH ART: 7.402 (ref 7.350–7.450)
Patient temperature: 35.7
TCO2: 24 mmol/L (ref 0–100)
TCO2: 26 mmol/L (ref 0–100)
TCO2: 26 mmol/L (ref 0–100)
TCO2: 21 mmol/L (ref 0–100)
TCO2: 24 mmol/L (ref 0–100)
pCO2 arterial: 39.9 mmHg (ref 35.0–45.0)
pCO2 arterial: 38.1 mmHg (ref 35.0–45.0)
pCO2 arterial: 31.1 mmHg — ABNORMAL LOW (ref 35.0–45.0)
pCO2 arterial: 46.2 mmHg — ABNORMAL HIGH (ref 35.0–45.0)
pH, Arterial: 7.372 (ref 7.350–7.450)
pH, Arterial: 7.253 — ABNORMAL LOW (ref 7.350–7.450)
pH, Arterial: 7.283 — ABNORMAL LOW (ref 7.350–7.450)
pO2, Arterial: 278 mmHg — ABNORMAL HIGH (ref 80.0–100.0)
pO2, Arterial: 259 mmHg — ABNORMAL HIGH (ref 80.0–100.0)
pO2, Arterial: 125 mmHg — ABNORMAL HIGH (ref 80.0–100.0)
pO2, Arterial: 114 mmHg — ABNORMAL HIGH (ref 80.0–100.0)
pO2, Arterial: 61 mmHg — ABNORMAL LOW (ref 80.0–100.0)

## 2015-12-16 LAB — URINALYSIS, ROUTINE W REFLEX MICROSCOPIC
Bilirubin Urine: NEGATIVE
Glucose, UA: NEGATIVE mg/dL
Hgb urine dipstick: NEGATIVE
Ketones, ur: NEGATIVE mg/dL
Leukocytes, UA: NEGATIVE
Nitrite: NEGATIVE
Protein, ur: NEGATIVE mg/dL
Specific Gravity, Urine: 1.022 (ref 1.005–1.030)
pH: 5.5 (ref 5.0–8.0)

## 2015-12-16 LAB — CBC
HCT: 40.4 % (ref 39.0–52.0)
HCT: 30.3 % — ABNORMAL LOW (ref 39.0–52.0)
HEMATOCRIT: 33.7 % — AB (ref 39.0–52.0)
HEMOGLOBIN: 11.3 g/dL — AB (ref 13.0–17.0)
Hemoglobin: 13.7 g/dL (ref 13.0–17.0)
Hemoglobin: 10.2 g/dL — ABNORMAL LOW (ref 13.0–17.0)
MCH: 28 pg (ref 26.0–34.0)
MCH: 28 pg (ref 26.0–34.0)
MCH: 27.9 pg (ref 26.0–34.0)
MCHC: 33.9 g/dL (ref 30.0–36.0)
MCHC: 33.5 g/dL (ref 30.0–36.0)
MCHC: 33.7 g/dL (ref 30.0–36.0)
MCV: 82.6 fL (ref 78.0–100.0)
MCV: 83.6 fL (ref 78.0–100.0)
MCV: 82.8 fL (ref 78.0–100.0)
Platelets: 241 10*3/uL (ref 150–400)
Platelets: 145 10*3/uL — ABNORMAL LOW (ref 150–400)
Platelets: 154 10*3/uL (ref 150–400)
RBC: 4.89 MIL/uL (ref 4.22–5.81)
RBC: 4.03 MIL/uL — ABNORMAL LOW (ref 4.22–5.81)
RBC: 3.66 MIL/uL — ABNORMAL LOW (ref 4.22–5.81)
RDW: 12.7 % (ref 11.5–15.5)
RDW: 12.8 % (ref 11.5–15.5)
RDW: 12.7 % (ref 11.5–15.5)
WBC: 6.1 10*3/uL (ref 4.0–10.5)
WBC: 15.8 10*3/uL — AB (ref 4.0–10.5)
WBC: 12.6 10*3/uL — ABNORMAL HIGH (ref 4.0–10.5)

## 2015-12-16 LAB — GLUCOSE, CAPILLARY
GLUCOSE-CAPILLARY: 87 mg/dL (ref 65–99)
GLUCOSE-CAPILLARY: 115 mg/dL — AB (ref 65–99)
Glucose-Capillary: 124 mg/dL — ABNORMAL HIGH (ref 65–99)
Glucose-Capillary: 131 mg/dL — ABNORMAL HIGH (ref 65–99)
Glucose-Capillary: 134 mg/dL — ABNORMAL HIGH (ref 65–99)
Glucose-Capillary: 154 mg/dL — ABNORMAL HIGH (ref 65–99)
Glucose-Capillary: 152 mg/dL — ABNORMAL HIGH (ref 65–99)
Glucose-Capillary: 135 mg/dL — ABNORMAL HIGH (ref 65–99)

## 2015-12-16 LAB — HEMOGLOBIN AND HEMATOCRIT, BLOOD
HCT: 26.9 % — ABNORMAL LOW (ref 39.0–52.0)
Hemoglobin: 9.2 g/dL — ABNORMAL LOW (ref 13.0–17.0)

## 2015-12-16 LAB — PROTIME-INR
INR: 1.78 — AB (ref 0.00–1.49)
Prothrombin Time: 20.7 seconds — ABNORMAL HIGH (ref 11.6–15.2)

## 2015-12-16 LAB — MAGNESIUM: Magnesium: 2.8 mg/dL — ABNORMAL HIGH (ref 1.7–2.4)

## 2015-12-16 LAB — APTT
APTT: 30 s (ref 24–37)
aPTT: 33 seconds (ref 24–37)

## 2015-12-16 LAB — PLATELET COUNT: Platelets: 149 10*3/uL — ABNORMAL LOW (ref 150–400)

## 2015-12-16 SURGERY — CORONARY ARTERY BYPASS GRAFTING (CABG)
Anesthesia: General | Site: Chest

## 2015-12-16 MED ORDER — MAGNESIUM SULFATE 4 GM/100ML IV SOLN
4.0000 g | Freq: Once | INTRAVENOUS | Status: AC
  Administered 2015-12-16: 4 g via INTRAVENOUS
  Filled 2015-12-16: qty 100

## 2015-12-16 MED ORDER — CHLORHEXIDINE GLUCONATE 0.12 % MT SOLN: 15.0000 mL | OROMUCOSAL | Status: AC

## 2015-12-16 MED ORDER — ACETAMINOPHEN 160 MG/5ML PO SOLN: 650.0000 mg | Freq: Once | ORAL | Status: AC

## 2015-12-16 MED ORDER — MIDAZOLAM HCL 2 MG/2ML IJ SOLN
INTRAMUSCULAR | Status: AC
  Filled 2015-12-16: qty 2

## 2015-12-16 MED ORDER — METOPROLOL TARTRATE 25 MG/10 ML ORAL SUSPENSION: 12.5000 mg | Freq: Two times a day (BID) | ORAL | Status: DC

## 2015-12-16 MED ORDER — OXYCODONE HCL 5 MG PO TABS
5.0000 mg | ORAL_TABLET | ORAL | Status: DC | PRN
  Administered 2015-12-17 – 2015-12-19 (×5): 5 mg via ORAL
  Filled 2015-12-16 (×5): qty 1

## 2015-12-16 MED ORDER — LACTATED RINGERS IV SOLN
INTRAVENOUS | Status: DC | PRN
  Administered 2015-12-16 (×2): via INTRAVENOUS

## 2015-12-16 MED ORDER — HEMOSTATIC AGENTS (NO CHARGE) OPTIME
TOPICAL | Status: DC | PRN
  Administered 2015-12-16 (×2): 1 via TOPICAL

## 2015-12-16 MED ORDER — ROCURONIUM BROMIDE 50 MG/5ML IV SOLN
INTRAVENOUS | Status: AC
  Filled 2015-12-16: qty 2

## 2015-12-16 MED ORDER — LACTATED RINGERS IV SOLN
INTRAVENOUS | Status: DC | PRN
  Administered 2015-12-16 (×2): via INTRAVENOUS

## 2015-12-16 MED ORDER — FENTANYL CITRATE (PF) 250 MCG/5ML IJ SOLN
INTRAMUSCULAR | Status: DC | PRN
  Administered 2015-12-16: 50 ug via INTRAVENOUS
  Administered 2015-12-16: 150 ug via INTRAVENOUS
  Administered 2015-12-16: 50 ug via INTRAVENOUS
  Administered 2015-12-16: 150 ug via INTRAVENOUS
  Administered 2015-12-16: 50 ug via INTRAVENOUS
  Administered 2015-12-16: 150 ug via INTRAVENOUS
  Administered 2015-12-16: 250 ug via INTRAVENOUS
  Administered 2015-12-16: 100 ug via INTRAVENOUS
  Administered 2015-12-16: 200 ug via INTRAVENOUS
  Administered 2015-12-16: 100 ug via INTRAVENOUS

## 2015-12-16 MED ORDER — SODIUM CHLORIDE 0.9 % IJ SOLN
OROMUCOSAL | Status: DC | PRN
  Administered 2015-12-16 (×3): 4 mL via TOPICAL

## 2015-12-16 MED ORDER — MIDAZOLAM HCL 10 MG/2ML IJ SOLN
INTRAMUSCULAR | Status: AC
Start: 2015-12-16 — End: 2015-12-16
  Filled 2015-12-16: qty 2

## 2015-12-16 MED ORDER — ROCURONIUM BROMIDE 100 MG/10ML IV SOLN
INTRAVENOUS | Status: DC | PRN
  Administered 2015-12-16: 30 mg via INTRAVENOUS
  Administered 2015-12-16: 70 mg via INTRAVENOUS
  Administered 2015-12-16: 25 mg via INTRAVENOUS

## 2015-12-16 MED ORDER — MIDAZOLAM HCL 5 MG/5ML IJ SOLN
INTRAMUSCULAR | Status: DC | PRN
  Administered 2015-12-16: 2 mg via INTRAVENOUS
  Administered 2015-12-16: 1 mg via INTRAVENOUS
  Administered 2015-12-16 (×2): 2 mg via INTRAVENOUS
  Administered 2015-12-16: 5 mg via INTRAVENOUS

## 2015-12-16 MED ORDER — LIDOCAINE HCL (CARDIAC) 20 MG/ML IV SOLN
INTRAVENOUS | Status: DC | PRN
  Administered 2015-12-16: 70 mg via INTRATRACHEAL

## 2015-12-16 MED ORDER — SODIUM CHLORIDE 0.9 % IV SOLN
INTRAVENOUS | Status: DC | PRN
  Administered 2015-12-16 (×2): via INTRAVENOUS

## 2015-12-16 MED ORDER — 0.9 % SODIUM CHLORIDE (POUR BTL) OPTIME
TOPICAL | Status: DC | PRN
  Administered 2015-12-16: 5000 mL

## 2015-12-16 MED ORDER — SODIUM CHLORIDE 0.9% FLUSH
3.0000 mL | Freq: Two times a day (BID) | INTRAVENOUS | Status: DC
  Administered 2015-12-17: 10 mL via INTRAVENOUS
  Administered 2015-12-17 – 2015-12-19 (×4): 3 mL via INTRAVENOUS

## 2015-12-16 MED ORDER — SODIUM CHLORIDE 0.9 % IV SOLN
10.0000 g | INTRAVENOUS | Status: DC | PRN
  Administered 2015-12-16: 1 g/h via INTRAVENOUS

## 2015-12-16 MED ORDER — LACTATED RINGERS IV SOLN: INTRAVENOUS | Status: DC

## 2015-12-16 MED ORDER — SODIUM CHLORIDE 0.9% FLUSH: 3.0000 mL | INTRAVENOUS | Status: DC | PRN

## 2015-12-16 MED ORDER — PHENYLEPHRINE 40 MCG/ML (10ML) SYRINGE FOR IV PUSH (FOR BLOOD PRESSURE SUPPORT)
PREFILLED_SYRINGE | INTRAVENOUS | Status: AC
  Filled 2015-12-16: qty 20

## 2015-12-16 MED ORDER — ACETAMINOPHEN 500 MG PO TABS
1000.0000 mg | ORAL_TABLET | Freq: Four times a day (QID) | ORAL | Status: DC
  Administered 2015-12-17 – 2015-12-20 (×11): 1000 mg via ORAL
  Filled 2015-12-16 (×11): qty 2

## 2015-12-16 MED ORDER — FENTANYL CITRATE (PF) 250 MCG/5ML IJ SOLN
INTRAMUSCULAR | Status: AC
  Filled 2015-12-16: qty 5

## 2015-12-16 MED ORDER — PROPOFOL 10 MG/ML IV BOLUS
INTRAVENOUS | Status: AC
  Filled 2015-12-16: qty 20

## 2015-12-16 MED ORDER — SODIUM CHLORIDE 0.45 % IV SOLN
INTRAVENOUS | Status: DC | PRN
  Administered 2015-12-16: 16:00:00 via INTRAVENOUS

## 2015-12-16 MED ORDER — SUGAMMADEX SODIUM 200 MG/2ML IV SOLN
INTRAVENOUS | Status: DC | PRN
  Administered 2015-12-16: 200 mg via INTRAVENOUS

## 2015-12-16 MED ORDER — DOPAMINE-DEXTROSE 1.6-5 MG/ML-% IV SOLN
INTRAVENOUS | Status: DC | PRN
  Administered 2015-12-16: 2 ug/kg/min via INTRAVENOUS

## 2015-12-16 MED ORDER — ACETAMINOPHEN 650 MG RE SUPP
650.0000 mg | Freq: Once | RECTAL | Status: AC
  Administered 2015-12-16: 650 mg via RECTAL

## 2015-12-16 MED ORDER — SUGAMMADEX SODIUM 200 MG/2ML IV SOLN
INTRAVENOUS | Status: AC
  Filled 2015-12-16: qty 2

## 2015-12-16 MED ORDER — SODIUM CHLORIDE 0.9 % IV SOLN: 250.0000 mL | INTRAVENOUS | Status: DC

## 2015-12-16 MED ORDER — ASPIRIN EC 325 MG PO TBEC
325.0000 mg | DELAYED_RELEASE_TABLET | Freq: Every day | ORAL | Status: DC
  Administered 2015-12-17 – 2015-12-20 (×4): 325 mg via ORAL
  Filled 2015-12-16 (×4): qty 1

## 2015-12-16 MED ORDER — AMINOCAPROIC ACID 250 MG/ML IV SOLN
10.0000 g | INTRAVENOUS | Status: DC | PRN
  Administered 2015-12-16: 5 g via INTRAVENOUS

## 2015-12-16 MED ORDER — LACTATED RINGERS IV SOLN
INTRAVENOUS | Status: DC | PRN
  Administered 2015-12-16 (×2): via INTRAVENOUS

## 2015-12-16 MED ORDER — METOPROLOL TARTRATE 12.5 MG HALF TABLET
12.5000 mg | ORAL_TABLET | Freq: Two times a day (BID) | ORAL | Status: DC
  Administered 2015-12-17 – 2015-12-20 (×6): 12.5 mg via ORAL
  Filled 2015-12-16 (×6): qty 1

## 2015-12-16 MED ORDER — DEXMEDETOMIDINE HCL IN NACL 400 MCG/100ML IV SOLN
INTRAVENOUS | Status: DC | PRN
  Administered 2015-12-16: 0.7 ug/kg/h via INTRAVENOUS

## 2015-12-16 MED ORDER — DEXTROSE 5 % IV SOLN
1.5000 g | INTRAVENOUS | Status: DC | PRN
  Administered 2015-12-16: 1.5 g via INTRAVENOUS

## 2015-12-16 MED ORDER — VANCOMYCIN HCL IN DEXTROSE 1-5 GM/200ML-% IV SOLN
1000.0000 mg | Freq: Once | INTRAVENOUS | Status: AC
  Administered 2015-12-16: 1000 mg via INTRAVENOUS
  Filled 2015-12-16: qty 200

## 2015-12-16 MED ORDER — NITROGLYCERIN IN D5W 200-5 MCG/ML-% IV SOLN: 0.0000 ug/min | INTRAVENOUS | Status: DC

## 2015-12-16 MED ORDER — CHLORHEXIDINE GLUCONATE 0.12 % MT SOLN
15.0000 mL | Freq: Two times a day (BID) | OROMUCOSAL | Status: DC
  Administered 2015-12-16 – 2015-12-19 (×4): 15 mL via OROMUCOSAL
  Filled 2015-12-16 (×5): qty 15

## 2015-12-16 MED ORDER — EPHEDRINE SULFATE 50 MG/ML IJ SOLN
INTRAMUSCULAR | Status: DC | PRN
  Administered 2015-12-16 (×3): 5 mg via INTRAVENOUS

## 2015-12-16 MED ORDER — KETOROLAC TROMETHAMINE 15 MG/ML IJ SOLN
15.0000 mg | Freq: Four times a day (QID) | INTRAMUSCULAR | Status: AC
  Administered 2015-12-16 – 2015-12-17 (×5): 15 mg via INTRAVENOUS
  Filled 2015-12-16 (×5): qty 1

## 2015-12-16 MED ORDER — PHENYLEPHRINE HCL 10 MG/ML IJ SOLN
INTRAMUSCULAR | Status: DC | PRN
  Administered 2015-12-16: 80 ug via INTRAVENOUS
  Administered 2015-12-16 (×2): 40 ug via INTRAVENOUS
  Administered 2015-12-16 (×4): 80 ug via INTRAVENOUS
  Administered 2015-12-16: 40 ug via INTRAVENOUS
  Administered 2015-12-16: 80 ug via INTRAVENOUS

## 2015-12-16 MED ORDER — LIDOCAINE HCL (CARDIAC) 20 MG/ML IV SOLN
INTRAVENOUS | Status: AC
  Filled 2015-12-16: qty 5

## 2015-12-16 MED ORDER — FAMOTIDINE IN NACL 20-0.9 MG/50ML-% IV SOLN
20.0000 mg | Freq: Two times a day (BID) | INTRAVENOUS | Status: AC
  Administered 2015-12-16: 20 mg via INTRAVENOUS

## 2015-12-16 MED ORDER — DOPAMINE-DEXTROSE 3.2-5 MG/ML-% IV SOLN: 3.0000 ug/kg/min | INTRAVENOUS | Status: DC

## 2015-12-16 MED ORDER — ONDANSETRON HCL 4 MG/2ML IJ SOLN
4.0000 mg | Freq: Four times a day (QID) | INTRAMUSCULAR | Status: DC | PRN
  Administered 2015-12-17: 4 mg via INTRAVENOUS
  Filled 2015-12-16: qty 2

## 2015-12-16 MED ORDER — MIDAZOLAM HCL 2 MG/2ML IJ SOLN: 2.0000 mg | INTRAMUSCULAR | Status: DC | PRN

## 2015-12-16 MED ORDER — DEXTROSE 5 % IV SOLN
0.7500 g | INTRAVENOUS | Status: DC | PRN
  Administered 2015-12-16: .75 g via INTRAVENOUS

## 2015-12-16 MED ORDER — PROPOFOL 10 MG/ML IV BOLUS
INTRAVENOUS | Status: DC | PRN
  Administered 2015-12-16: 20 mg via INTRAVENOUS
  Administered 2015-12-16: 30 mg via INTRAVENOUS
  Administered 2015-12-16: 40 mg via INTRAVENOUS

## 2015-12-16 MED ORDER — MORPHINE SULFATE (PF) 2 MG/ML IV SOLN: 2.0000 mg | INTRAVENOUS | Status: AC | PRN

## 2015-12-16 MED ORDER — INSULIN REGULAR BOLUS VIA INFUSION
0.0000 [IU] | Freq: Three times a day (TID) | INTRAVENOUS | Status: DC
  Filled 2015-12-16: qty 10

## 2015-12-16 MED ORDER — ACETAMINOPHEN 160 MG/5ML PO SOLN: 1000.0000 mg | Freq: Four times a day (QID) | ORAL | Status: DC

## 2015-12-16 MED ORDER — TRAMADOL HCL 50 MG PO TABS
50.0000 mg | ORAL_TABLET | ORAL | Status: DC | PRN
  Administered 2015-12-17: 50 mg via ORAL
  Administered 2015-12-18 (×2): 100 mg via ORAL
  Filled 2015-12-16: qty 2
  Filled 2015-12-16: qty 1
  Filled 2015-12-16 (×2): qty 2

## 2015-12-16 MED ORDER — KETOROLAC TROMETHAMINE 30 MG/ML IJ SOLN
30.0000 mg | Freq: Once | INTRAMUSCULAR | Status: AC
  Administered 2015-12-16 (×2): 30 mg via INTRAVENOUS

## 2015-12-16 MED ORDER — DEXMEDETOMIDINE HCL IN NACL 200 MCG/50ML IV SOLN: 0.0000 ug/kg/h | INTRAVENOUS | Status: DC

## 2015-12-16 MED ORDER — METOPROLOL TARTRATE 1 MG/ML IV SOLN: 2.5000 mg | INTRAVENOUS | Status: DC | PRN

## 2015-12-16 MED ORDER — SODIUM CHLORIDE 0.9 % IV SOLN
INTRAVENOUS | Status: DC
  Administered 2015-12-16: 15:00:00 via INTRAVENOUS

## 2015-12-16 MED ORDER — FENTANYL CITRATE (PF) 250 MCG/5ML IJ SOLN
INTRAMUSCULAR | Status: AC
  Filled 2015-12-16: qty 15

## 2015-12-16 MED ORDER — ASPIRIN 81 MG PO CHEW
CHEWABLE_TABLET | ORAL | Status: AC
  Filled 2015-12-16: qty 1

## 2015-12-16 MED ORDER — INSULIN REGULAR HUMAN 100 UNIT/ML IJ SOLN
INTRAMUSCULAR | Status: DC
  Filled 2015-12-16: qty 2.5

## 2015-12-16 MED ORDER — ASPIRIN 81 MG PO CHEW: 324.0000 mg | CHEWABLE_TABLET | Freq: Every day | ORAL | Status: DC

## 2015-12-16 MED ORDER — POTASSIUM CHLORIDE 10 MEQ/50ML IV SOLN: 10.0000 meq | INTRAVENOUS | Status: AC

## 2015-12-16 MED ORDER — CETYLPYRIDINIUM CHLORIDE 0.05 % MT LIQD
7.0000 mL | Freq: Two times a day (BID) | OROMUCOSAL | Status: DC
  Administered 2015-12-17 – 2015-12-18 (×3): 7 mL via OROMUCOSAL

## 2015-12-16 MED ORDER — BISACODYL 10 MG RE SUPP: 10.0000 mg | Freq: Every day | RECTAL | Status: DC

## 2015-12-16 MED ORDER — SODIUM CHLORIDE 0.9 % IV SOLN
250.0000 [IU] | INTRAVENOUS | Status: DC | PRN
  Administered 2015-12-16: .6 [IU]/h via INTRAVENOUS

## 2015-12-16 MED ORDER — PLASMA-LYTE 148 IV SOLN
INTRAVENOUS | Status: DC | PRN
  Administered 2015-12-16: 500 mL via INTRAVASCULAR

## 2015-12-16 MED ORDER — SODIUM CHLORIDE 0.9 % IV SOLN
20.0000 ug | Freq: Once | INTRAVENOUS | Status: AC
  Administered 2015-12-16: 20 ug via INTRAVENOUS
  Filled 2015-12-16: qty 5

## 2015-12-16 MED ORDER — DEXTROSE 5 % IV SOLN
1.5000 g | Freq: Two times a day (BID) | INTRAVENOUS | Status: AC
  Administered 2015-12-16 – 2015-12-18 (×4): 1.5 g via INTRAVENOUS
  Filled 2015-12-16 (×4): qty 1.5

## 2015-12-16 MED ORDER — DEXTROSE 5 % IV SOLN
20.0000 mg | INTRAVENOUS | Status: DC | PRN
  Administered 2015-12-16: 40 ug/min via INTRAVENOUS

## 2015-12-16 MED ORDER — BISACODYL 5 MG PO TBEC
10.0000 mg | DELAYED_RELEASE_TABLET | Freq: Every day | ORAL | Status: DC
  Administered 2015-12-17: 10 mg via ORAL
  Filled 2015-12-16 (×3): qty 2

## 2015-12-16 MED ORDER — VANCOMYCIN HCL 1000 MG IV SOLR
1000.0000 mg | INTRAVENOUS | Status: DC | PRN
  Administered 2015-12-16: 1500 mg via INTRAVENOUS

## 2015-12-16 MED ORDER — DOCUSATE SODIUM 100 MG PO CAPS
200.0000 mg | ORAL_CAPSULE | Freq: Every day | ORAL | Status: DC
  Administered 2015-12-17: 200 mg via ORAL
  Filled 2015-12-16 (×3): qty 2

## 2015-12-16 MED ORDER — KETOROLAC TROMETHAMINE 30 MG/ML IJ SOLN
INTRAMUSCULAR | Status: AC
  Administered 2015-12-16: 30 mg via INTRAVENOUS
  Filled 2015-12-16: qty 1

## 2015-12-16 MED ORDER — MORPHINE SULFATE (PF) 2 MG/ML IV SOLN
1.0000 mg | INTRAVENOUS | Status: AC | PRN
  Administered 2015-12-16 (×2): 1 mg via INTRAVENOUS
  Filled 2015-12-16: qty 1

## 2015-12-16 MED ORDER — LACTATED RINGERS IV SOLN: 500.0000 mL | Freq: Once | INTRAVENOUS | Status: DC | PRN

## 2015-12-16 MED ORDER — PHENYLEPHRINE HCL 10 MG/ML IJ SOLN
0.0000 ug/min | INTRAVENOUS | Status: DC
  Filled 2015-12-16: qty 2

## 2015-12-16 MED ORDER — PANTOPRAZOLE SODIUM 40 MG PO TBEC
40.0000 mg | DELAYED_RELEASE_TABLET | Freq: Every day | ORAL | Status: DC
  Administered 2015-12-18 – 2015-12-20 (×3): 40 mg via ORAL
  Filled 2015-12-16 (×3): qty 1

## 2015-12-16 MED ORDER — HEPARIN SODIUM (PORCINE) 1000 UNIT/ML IJ SOLN
INTRAMUSCULAR | Status: DC | PRN
  Administered 2015-12-16: 2000 [IU] via INTRAVENOUS
  Administered 2015-12-16: 4000 [IU] via INTRAVENOUS
  Administered 2015-12-16: 26000 [IU] via INTRAVENOUS

## 2015-12-16 MED ORDER — PROTAMINE SULFATE 10 MG/ML IV SOLN
INTRAVENOUS | Status: DC | PRN
  Administered 2015-12-16: 280 mg via INTRAVENOUS

## 2015-12-16 MED ORDER — METOCLOPRAMIDE HCL 5 MG/ML IJ SOLN
10.0000 mg | Freq: Four times a day (QID) | INTRAMUSCULAR | Status: AC
  Administered 2015-12-16 – 2015-12-17 (×4): 10 mg via INTRAVENOUS
  Filled 2015-12-16 (×3): qty 2

## 2015-12-16 MED ORDER — ALBUMIN HUMAN 5 % IV SOLN
250.0000 mL | INTRAVENOUS | Status: AC | PRN
  Administered 2015-12-16 (×2): 250 mL via INTRAVENOUS

## 2015-12-16 MED ORDER — HEPARIN SODIUM (PORCINE) 1000 UNIT/ML IJ SOLN
INTRAMUSCULAR | Status: AC
  Filled 2015-12-16: qty 1

## 2015-12-16 SURGICAL SUPPLY — 88 items
ADAPTER CARDIO PERF ANTE/RETRO (ADAPTER) ×4 IMPLANT
ADPR PRFSN 84XANTGRD RTRGD (ADAPTER) ×2
BAG DECANTER FOR FLEXI CONT (MISCELLANEOUS) ×4 IMPLANT
BANDAGE ELASTIC 4 VELCRO ST LF (GAUZE/BANDAGES/DRESSINGS) ×4 IMPLANT
BANDAGE ELASTIC 6 VELCRO ST LF (GAUZE/BANDAGES/DRESSINGS) ×4 IMPLANT
BASKET HEART  (ORDER IN 25'S) (MISCELLANEOUS) ×1
BASKET HEART (ORDER IN 25'S) (MISCELLANEOUS) ×1
BASKET HEART (ORDER IN 25S) (MISCELLANEOUS) ×2 IMPLANT
BLADE STERNUM SYSTEM 6 (BLADE) ×4 IMPLANT
BLADE SURG 11 STRL SS (BLADE) ×2 IMPLANT
BLADE SURG 12 STRL SS (BLADE) ×4 IMPLANT
BNDG GAUZE ELAST 4 BULKY (GAUZE/BANDAGES/DRESSINGS) ×4 IMPLANT
CANISTER SUCTION 2500CC (MISCELLANEOUS) ×4 IMPLANT
CANNULA GUNDRY RCSP 15FR (MISCELLANEOUS) ×4 IMPLANT
CATH CPB KIT VANTRIGT (MISCELLANEOUS) ×4 IMPLANT
CATH ROBINSON RED A/P 18FR (CATHETERS) ×12 IMPLANT
CATH THORACIC 36FR RT ANG (CATHETERS) ×4 IMPLANT
CLIP RETRACTION 3.0MM CORONARY (MISCELLANEOUS) ×2 IMPLANT
CLIP TI WIDE RED SMALL 24 (CLIP) ×2 IMPLANT
COVER SURGICAL LIGHT HANDLE (MISCELLANEOUS) ×4 IMPLANT
CRADLE DONUT ADULT HEAD (MISCELLANEOUS) ×4 IMPLANT
DRAIN CHANNEL 32F RND 10.7 FF (WOUND CARE) ×4 IMPLANT
DRAPE CARDIOVASCULAR INCISE (DRAPES) ×4
DRAPE SLUSH/WARMER DISC (DRAPES) ×4 IMPLANT
DRAPE SRG 135X102X78XABS (DRAPES) ×2 IMPLANT
DRSG AQUACEL AG ADV 3.5X14 (GAUZE/BANDAGES/DRESSINGS) ×4 IMPLANT
ELECT BLADE 4.0 EZ CLEAN MEGAD (MISCELLANEOUS) ×4
ELECT BLADE 6.5 EXT (BLADE) ×4 IMPLANT
ELECT CAUTERY BLADE 6.4 (BLADE) ×4 IMPLANT
ELECT REM PT RETURN 9FT ADLT (ELECTROSURGICAL) ×8
ELECTRODE BLDE 4.0 EZ CLN MEGD (MISCELLANEOUS) ×2 IMPLANT
ELECTRODE REM PT RTRN 9FT ADLT (ELECTROSURGICAL) ×4 IMPLANT
FELT TEFLON 1X6 (MISCELLANEOUS) ×3 IMPLANT
GAUZE SPONGE 4X4 12PLY STRL (GAUZE/BANDAGES/DRESSINGS) ×8 IMPLANT
GLOVE BIO SURGEON STRL SZ 6.5 (GLOVE) ×12 IMPLANT
GLOVE BIO SURGEON STRL SZ7.5 (GLOVE) ×12 IMPLANT
GLOVE BIO SURGEONS STRL SZ 6.5 (GLOVE) ×12
GLOVE BIOGEL PI IND STRL 6 (GLOVE) IMPLANT
GLOVE BIOGEL PI IND STRL 6.5 (GLOVE) ×20 IMPLANT
GLOVE BIOGEL PI INDICATOR 6 (GLOVE) ×12
GLOVE BIOGEL PI INDICATOR 6.5 (GLOVE) ×20
GOWN STRL REUS W/ TWL LRG LVL3 (GOWN DISPOSABLE) ×8 IMPLANT
GOWN STRL REUS W/TWL LRG LVL3 (GOWN DISPOSABLE) ×48
HEMOSTAT POWDER SURGIFOAM 1G (HEMOSTASIS) ×12 IMPLANT
HEMOSTAT SURGICEL 2X14 (HEMOSTASIS) ×4 IMPLANT
KIT BASIN OR (CUSTOM PROCEDURE TRAY) ×4 IMPLANT
KIT ROOM TURNOVER OR (KITS) ×4 IMPLANT
KIT SUCTION CATH 14FR (SUCTIONS) ×4 IMPLANT
KIT VASOVIEW W/TROCAR VH 2000 (KITS) ×4 IMPLANT
LEAD PACING MYOCARDI (MISCELLANEOUS) ×4 IMPLANT
MARKER GRAFT CORONARY BYPASS (MISCELLANEOUS) ×12 IMPLANT
NS IRRIG 1000ML POUR BTL (IV SOLUTION) ×20 IMPLANT
PACK OPEN HEART (CUSTOM PROCEDURE TRAY) ×4 IMPLANT
PAD ARMBOARD 7.5X6 YLW CONV (MISCELLANEOUS) ×8 IMPLANT
PAD ELECT DEFIB RADIOL ZOLL (MISCELLANEOUS) ×4 IMPLANT
PENCIL BUTTON HOLSTER BLD 10FT (ELECTRODE) ×4 IMPLANT
PUNCH AORTIC ROTATE  4.5MM 8IN (MISCELLANEOUS) ×2 IMPLANT
SET CARDIOPLEGIA MPS 5001102 (MISCELLANEOUS) ×2 IMPLANT
SOLUTION ANTI FOG 6CC (MISCELLANEOUS) ×2 IMPLANT
SPONGE GAUZE 4X4 12PLY STER LF (GAUZE/BANDAGES/DRESSINGS) ×2 IMPLANT
SURGIFLO W/THROMBIN 8M KIT (HEMOSTASIS) ×4 IMPLANT
SUT BONE WAX W31G (SUTURE) ×4 IMPLANT
SUT MNCRL AB 4-0 PS2 18 (SUTURE) ×2 IMPLANT
SUT PROLENE 4 0 RB 1 (SUTURE) ×4
SUT PROLENE 4 0 SH DA (SUTURE) ×4 IMPLANT
SUT PROLENE 4-0 RB1 .5 CRCL 36 (SUTURE) ×2 IMPLANT
SUT PROLENE 6 0 CC (SUTURE) ×15 IMPLANT
SUT PROLENE BLUE 7 0 (SUTURE) ×6 IMPLANT
SUT SILK 2 0 SH CR/8 (SUTURE) ×3 IMPLANT
SUT SILK 3 0 SH CR/8 (SUTURE) ×3 IMPLANT
SUT STEEL 6MS V (SUTURE) ×6 IMPLANT
SUT STEEL SZ 6 DBL 3X14 BALL (SUTURE) ×4 IMPLANT
SUT VIC AB 1 CTX 36 (SUTURE) ×20
SUT VIC AB 1 CTX36XBRD ANBCTR (SUTURE) ×4 IMPLANT
SUT VIC AB 2-0 CT1 27 (SUTURE) ×4
SUT VIC AB 2-0 CT1 TAPERPNT 27 (SUTURE) IMPLANT
SUT VIC AB 2-0 CTX 27 (SUTURE) ×4 IMPLANT
SUTURE E-PAK OPEN HEART (SUTURE) ×4 IMPLANT
SYSTEM SAHARA CHEST DRAIN ATS (WOUND CARE) ×4 IMPLANT
TAPE CLOTH SURG 4X10 WHT LF (GAUZE/BANDAGES/DRESSINGS) ×2 IMPLANT
TAPE CLOTH SURG 6X10 WHT LF (GAUZE/BANDAGES/DRESSINGS) ×3 IMPLANT
TAPE PAPER 3X10 WHT MICROPORE (GAUZE/BANDAGES/DRESSINGS) ×2 IMPLANT
TOWEL OR 17X24 6PK STRL BLUE (TOWEL DISPOSABLE) ×8 IMPLANT
TOWEL OR 17X26 10 PK STRL BLUE (TOWEL DISPOSABLE) ×8 IMPLANT
TRAY FOLEY IC TEMP SENS 16FR (CATHETERS) ×4 IMPLANT
TUBING INSUFFLATION (TUBING) ×4 IMPLANT
UNDERPAD 30X30 INCONTINENT (UNDERPADS AND DIAPERS) ×4 IMPLANT
WATER STERILE IRR 1000ML POUR (IV SOLUTION) ×8 IMPLANT

## 2015-12-16 NOTE — Transfer of Care (Signed)
Immediate Anesthesia Transfer of Care Note  Patient: Adam Taylor  Procedure(s) Performed: Procedure(s): CORONARY ARTERY BYPASS GRAFTING: LIMA to LAD, SVG to OM1 and SVG to OM2.  ENDOSCOPIC HARVEST OF GREATER SAPHENOUS VEIN (RIGHT THIGH) (N/A) INTRAOPERATIVE TRANSESOPHAGEAL ECHOCARDIOGRAM (N/A)  Patient Location: ICU  Anesthesia Type:General  Level of Consciousness: awake  Airway & Oxygen Therapy: Patient Spontanous Breathing and Patient connected to face mask oxygen  Post-op Assessment: Report given to RN and Post -op Vital signs reviewed and stable  Post vital signs: Reviewed and stable  Last Vitals:  Filed Vitals:   12/16/15 0500 12/16/15 0600  BP: 144/85   Pulse: 64 70  Temp:    Resp: 17 17    Complications: No apparent anesthesia complications

## 2015-12-16 NOTE — Progress Notes (Signed)
Echocardiogram Echocardiogram Transesophageal has been performed.  Adam Taylor 12/16/2015, 8:37 AM

## 2015-12-16 NOTE — Anesthesia Preprocedure Evaluation (Addendum)
Anesthesia Evaluation  Patient identified by MRN, date of birth, ID band Patient awake    Reviewed: Allergy & Precautions, NPO status , Patient's Chart, lab work & pertinent test results, reviewed documented beta blocker date and time   History of Anesthesia Complications Negative for: history of anesthetic complications  Airway Mallampati: I  TM Distance: >3 FB Neck ROM: Full    Dental  (+) Teeth Intact, Dental Advisory Given   Pulmonary neg pulmonary ROS,    breath sounds clear to auscultation       Cardiovascular + angina + CAD  (-) Past MI and (-) CHF (-) dysrhythmias  Rhythm:Regular     Neuro/Psych negative neurological ROS  negative psych ROS   GI/Hepatic Neg liver ROS, GERD  Medicated and Controlled,  Endo/Other  negative endocrine ROS  Renal/GU negative Renal ROS     Musculoskeletal   Abdominal   Peds  Hematology negative hematology ROS (+)   Anesthesia Other Findings   Reproductive/Obstetrics                            Anesthesia Physical Anesthesia Plan  ASA: IV  Anesthesia Plan: General   Post-op Pain Management:    Induction: Intravenous  Airway Management Planned: Oral ETT  Additional Equipment: Arterial line, TEE, CVP, Ultrasound Guidance Line Placement and PA Cath  Intra-op Plan:   Post-operative Plan: Post-operative intubation/ventilation  Informed Consent: I have reviewed the patients History and Physical, chart, labs and discussed the procedure including the risks, benefits and alternatives for the proposed anesthesia with the patient or authorized representative who has indicated his/her understanding and acceptance.   Dental advisory given  Plan Discussed with: CRNA and Surgeon  Anesthesia Plan Comments:         Anesthesia Quick Evaluation

## 2015-12-16 NOTE — Op Note (Signed)
NAMEMarland Kitchen  WHITAKER, HOLDERMAN NO.:  [id number]  MEDICAL RECORD NO.:  [id number]  LOCATION:  2S15C                        FACILITY:  [facility]  PHYSICIAN:  Kerin Perna, M.D.  DATE OF BIRTH:  1966/01/30  DATE OF PROCEDURE:  12/16/2015 DATE OF DISCHARGE:                              OPERATIVE REPORT   OPERATION: 1. Coronary artery bypass grafting x3 (left internal mammary artery to     LAD, saphenous vein graft to OM1, saphenous vein graft to OM2). 2. Endoscopic harvest of right leg greater saphenous vein.  SURGEON:  Kerin Perna, M.D.  ASSISTANT:  Lowella Dandy, PA-C.  ANESTHESIA:  General by Dr. Corky Sox.  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  CLINICAL NOTE:  The patient is a 50 year old, Caucasian male, nondiabetic, nonsmoker with hyperlipidemia who presents with exertional chest pain and positive stress test.  Cardiac catheterization performed by Dr. [place] demonstrated a 95% distal left main stenosis.  Right coronary was dominant without significant disease.  Ejection fraction was normal.  Surgical coronary revascularization was recommended by Cardiology.  I discussed the procedure of multivessel CABG for treatment of the patient's severe left main stenosis with the patient and wife.  I discussed the indications, benefits, and risks of surgery.  I discussed the details of surgery including the use of general anesthesia and cardiopulmonary bypass, the expected postoperative recovery, and the potential risks, including risks of stroke, MI, bleeding, blood transfusion requirement, postoperative infection, postoperative pleural problems including pleural effusion, multisystem failure, and death. After reviewing this information with the patient, he demonstrated his understanding and agreed to proceed with surgery under what I felt was an informed  consent.  FINDINGS: 1. Adequate conduit. 2. Diffuse LAD disease, but adequate target.  Circumflex marginal     vessels small but graftable. 3. Preserved LV systolic function by transesophageal echo.  OPERATIVE PROCEDURE:  The patient was brought to the operating room and placed supine on the operating table, where general anesthesia was induced under invasive hemodynamic monitoring.  The chest, abdomen, and legs were prepped with Betadine and draped as a sterile field.  A proper time-out was performed.  A sternal incision was made.  The saphenous vein was harvested endoscopically from the right leg.  The left internal mammary artery was harvested as a pedicle graft from its origin at the subclavian vessels.  It was 1.5-mm vessel with adequate flow.  The sternal retractor was placed.  The pericardium was opened and suspended. Pursestrings were placed in the ascending aorta and right atrium and heparin was administered.  When the ACT was documented as being therapeutic, the patient was cannulated and placed on cardiopulmonary bypass.  The coronaries were identified for grafting.  The LAD was diffusely diseased and bypassed at the distal aspect.  It was 1.5-mm vessel.  The OM1 was 1.4-mm vessel and was intramyocardial.  The OM2 was a small 1.0 mm vessel, but barely graftable.  The saphenous vein and mammary artery were prepared for the distal anastomoses and cardioplegia cannulas were placed both antegrade and retrograde cold blood cardioplegia.  The patient was cooled  to 32 degrees.  Aortic crossclamp was applied.  1 L of cold blood cardioplegia was delivered in split doses between the antegrade aortic and retrograde coronary sinus catheters.  There was good cardioplegic arrest, and septal temperature dropped less than 14 degrees.  Cardioplegia was delivered every 20 minutes.  The distal coronary anastomoses were performed.  The first distal anastomosis was the OM 2.  This was a small  vessel, but 1 mm.  A reverse saphenous vein was sewn end-to-side with running 7-0 Prolene with adequate flow through the graft.  Cardioplegia was redosed.  The second distal anastomosis was the OM1.  This was a large 1.4-mm vessel intramyocardial with proximal left main stenosis.  A reverse saphenous vein was sewn end-to-side with running 7-0 Prolene.  There was good flow through the graft.  Cardioplegia was redosed.  The third distal anastomosis was the distal LAD.  This was a 1.5-mm vessel proximal 95% left main stenosis.  The left IMA pedicle was brought through an opening and the left lateral pericardium was brought onto the LAD and sewn end-to-side with running 8-0 Prolene.  There was good flow through the anastomosis after briefly releasing the bulldog clamp on the mammary pedicle.  The bulldog was clamp was reapplied and the pedicle was secured to the epicardium with 6-0 Prolene. Cardioplegia was redosed.  With the cross-clamp still in place, 2 proximal vein anastomoses were performed on the ascending aorta using a 4.5 mm punch and running 6-0 Prolene.  Prior to tying down the final proximal anastomosis, air was vented from the coronaries with a dose of retrograde warm blood cardioplegia.  The crossclamp was removed. The heart was cardioverted back to a regular rhythm.  The vein grafts were de-aired and opened and each had good flow.  Hemostasis was documented at the proximal and distal anastomoses.  The patient was rewarmed and reperfused.  Temporary pacing wires were applied.  The lungs were expanded and ventilator was resumed. The patient was weaned from cardiopulmonary bypass without difficulty. Cardiac output and hemodynamics were normal.  Echo was with preserved LV function.  Protamine was administered and the cannulas removed.  The patient remained hemodynamically stable.  The mediastinum was irrigated with warm saline.  The superior pericardial fat was closed over  the aorta and right ventricle.  Anterior mediastinal and left pleural chest tube were placed and brought through separate incisions.  The sternum was closed with wire.  The pectoralis fascia was closed with a running #1 Vicryl.  The subcutaneous and skin layers were closed in running Vicryl and sterile dressings were applied.  Total cardiopulmonary bypass time was 110 minutes.  The patient returned to the ICU in critical, but stable condition.     Kerin Perna, M.D.     PV/MEDQ  D:  12/16/2015  T:  12/16/2015  Job:  [id number]

## 2015-12-16 NOTE — Anesthesia Postprocedure Evaluation (Signed)
Anesthesia Post Note  Patient: Adam Taylor  Procedure(s) Performed: Procedure(s) (LRB): CORONARY ARTERY BYPASS GRAFTING: LIMA to LAD, SVG to OM1 and SVG to OM2.  ENDOSCOPIC HARVEST OF GREATER SAPHENOUS VEIN (RIGHT THIGH) (N/A) INTRAOPERATIVE TRANSESOPHAGEAL ECHOCARDIOGRAM (N/A)  Patient location during evaluation: ICU Anesthesia Type: General Level of consciousness: awake and alert Pain management: pain level controlled Vital Signs Assessment: post-procedure vital signs reviewed and stable Respiratory status: spontaneous breathing, patient connected to nasal cannula oxygen and respiratory function stable Cardiovascular status: stable Anesthetic complications: no    Last Vitals:  Filed Vitals:   12/16/15 1645 12/16/15 1651  BP:    Pulse: 76 70  Temp: 35.9 C 35.9 C  Resp: 8 8    Last Pain:  Filed Vitals:   12/16/15 1653  PainSc: 0-No pain                 Jolana Runkles

## 2015-12-16 NOTE — Progress Notes (Signed)
Wife Becky cell phone [phone]

## 2015-12-16 NOTE — Brief Op Note (Signed)
12/15/2015 - 12/16/2015  12:29 PM  PATIENT:  Adam Taylor  50 y.o. male  PRE-OPERATIVE DIAGNOSIS:  Coronary Artery Disease  POST-OPERATIVE DIAGNOSIS:  Coronary Artery Disease  PROCEDURE:  Procedure(s):  CORONARY ARTERY BYPASS GRAFTING  X 3 -LIMA to LAD -SVG to OM1 -SVG to OM2  ENDOSCOPIC HARVEST GREATER SAPHENOUS VEIN -Right Thigh  INTRAOPERATIVE TRANSESOPHAGEAL ECHOCARDIOGRAM (N/A)  SURGEON:  Surgeon(s) and Role:    * Kerin Perna, MD - Primary  PHYSICIAN ASSISTANT: Lowella Dandy PA-C  ANESTHESIA:   general  EBL:  Total I/O In: 2000 [I.V.:2000] Out: 1050 [Urine:1050]  BLOOD ADMINISTERED: CELLSAVER  DRAINS: Left Pleural Chest Tubes, Mediastinal Chest Drains   LOCAL MEDICATIONS USED:  NONE  SPECIMEN:  No Specimen  DISPOSITION OF SPECIMEN:  N/A  COUNTS:  YES  TOURNIQUET:  * No tourniquets in log *  DICTATION: .Dragon Dictation  PLAN OF CARE: Admit to inpatient   PATIENT DISPOSITION:  ICU - intubated and hemodynamically stable.   Delay start of Pharmacological VTE agent (>24hrs) due to surgical blood loss or risk of bleeding: yes

## 2015-12-16 NOTE — Anesthesia Procedure Notes (Signed)
Central Venous Catheter Insertion Performed by: anesthesiologist Patient location: Pre-op. Preanesthetic checklist: patient identified, IV checked, site marked, risks and benefits discussed, surgical consent, monitors and equipment checked, pre-op evaluation, timeout performed and anesthesia consent Position: Trendelenburg Landmarks identified and Seldinger technique used Catheter size: 8.5 Fr Central line was placed.Sheath introducer Swan type and PA catheter depth:thermodilationProcedure performed using ultrasound guided technique. Attempts: 1 Following insertion, line sutured and dressing applied. Post procedure assessment: blood return through all ports, free fluid flow and no air. Patient tolerated the procedure well with no immediate complications.    Central Venous Catheter Insertion Performed by: anesthesiologist Patient location: Pre-op. Preanesthetic checklist: patient identified, IV checked, site marked, risks and benefits discussed, surgical consent, monitors and equipment checked, pre-op evaluation, timeout performed and anesthesia consent Landmarks identified PA cath was placed.Swan type and PA catheter depth:thermodilation and 48PA Cath depth:48 Procedure performed without using ultrasound guided technique. Attempts: 1 Patient tolerated the procedure well with no immediate complications.

## 2015-12-16 NOTE — OR Nursing (Signed)
1st call made  to 2S, providing a patient update (off pump).  Plans for transfer to Room 15 postoperatively. 2nd call made  to 2S, providing a patient update (45 minutes).   3rd call made  to 2S, providing a patient update (20 minutes). 4th call made  to 2S, providing a patient update (leaving now and transferring to Room 15).

## 2015-12-16 NOTE — Progress Notes (Signed)
Pt arrived from OR already extubated. Pt on simple mask but changed to 4L [place] with etco2 monitoring after abg obtained. RT will continue to closely monitor.

## 2015-12-16 NOTE — Progress Notes (Signed)
The patient was examined and preop studies reviewed. There has been no change from the prior exam and the patient is ready for surgery.  plan CABG on Adam Taylor

## 2015-12-16 NOTE — Progress Notes (Signed)
Patient placed on CPAP for HS due to periods of apnea.  Patient has never had sleep study.  Patient tolerated CPAP very well, full face mask used due to mouth breathing.  Patient is able to remove mask in case of emergency.  Auto titration mode used, minimum 5, maximum 20.

## 2015-12-16 NOTE — Progress Notes (Signed)
CT surgery p.m. Rounds  Status post CABG for 95% left main stenosis Patient extubated early postop with initial hypercarbic acidosis now improved We'll use CPAP daily at bedtime for probable sleep apnea history Hemodynamic stable on nitroglycerin only

## 2015-12-17 ENCOUNTER — Inpatient Hospital Stay (HOSPITAL_COMMUNITY): Payer: BC Managed Care – PPO

## 2015-12-17 LAB — POCT I-STAT, CHEM 8
BUN: 10 mg/dL (ref 6–20)
CALCIUM ION: 1.08 mmol/L — AB (ref 1.12–1.23)
CHLORIDE: 101 mmol/L (ref 101–111)
CREATININE: 1 mg/dL (ref 0.61–1.24)
Glucose, Bld: 108 mg/dL — ABNORMAL HIGH (ref 65–99)
HEMATOCRIT: 31 % — AB (ref 39.0–52.0)
Hemoglobin: 10.5 g/dL — ABNORMAL LOW (ref 13.0–17.0)
Potassium: 3.9 mmol/L (ref 3.5–5.1)
SODIUM: 137 mmol/L (ref 135–145)
TCO2: 24 mmol/L (ref 0–100)

## 2015-12-17 LAB — CBC
HCT: 29.9 % — ABNORMAL LOW (ref 39.0–52.0)
HEMATOCRIT: 30 % — AB (ref 39.0–52.0)
HEMOGLOBIN: 10.1 g/dL — AB (ref 13.0–17.0)
HEMOGLOBIN: 10.1 g/dL — AB (ref 13.0–17.0)
MCH: 28 pg (ref 26.0–34.0)
MCH: 28.3 pg (ref 26.0–34.0)
MCHC: 33.8 g/dL (ref 30.0–36.0)
MCHC: 33.7 g/dL (ref 30.0–36.0)
MCV: 82.8 fL (ref 78.0–100.0)
MCV: 84 fL (ref 78.0–100.0)
PLATELETS: 183 10*3/uL (ref 150–400)
Platelets: 149 10*3/uL — ABNORMAL LOW (ref 150–400)
RBC: 3.61 MIL/uL — AB (ref 4.22–5.81)
RBC: 3.57 MIL/uL — ABNORMAL LOW (ref 4.22–5.81)
RDW: 12.8 % (ref 11.5–15.5)
RDW: 12.8 % (ref 11.5–15.5)
WBC: 11.1 10*3/uL — ABNORMAL HIGH (ref 4.0–10.5)
WBC: 10.3 10*3/uL (ref 4.0–10.5)

## 2015-12-17 LAB — GLUCOSE, CAPILLARY
GLUCOSE-CAPILLARY: 106 mg/dL — AB (ref 65–99)
GLUCOSE-CAPILLARY: 107 mg/dL — AB (ref 65–99)
GLUCOSE-CAPILLARY: 111 mg/dL — AB (ref 65–99)
GLUCOSE-CAPILLARY: 114 mg/dL — AB (ref 65–99)
GLUCOSE-CAPILLARY: 120 mg/dL — AB (ref 65–99)
GLUCOSE-CAPILLARY: 123 mg/dL — AB (ref 65–99)
GLUCOSE-CAPILLARY: 81 mg/dL (ref 65–99)
Glucose-Capillary: 124 mg/dL — ABNORMAL HIGH (ref 65–99)
Glucose-Capillary: 114 mg/dL — ABNORMAL HIGH (ref 65–99)
Glucose-Capillary: 113 mg/dL — ABNORMAL HIGH (ref 65–99)
Glucose-Capillary: 90 mg/dL (ref 65–99)
Glucose-Capillary: 106 mg/dL — ABNORMAL HIGH (ref 65–99)
Glucose-Capillary: 147 mg/dL — ABNORMAL HIGH (ref 65–99)

## 2015-12-17 LAB — BASIC METABOLIC PANEL
ANION GAP: 7 (ref 5–15)
BUN: 9 mg/dL (ref 6–20)
CHLORIDE: 107 mmol/L (ref 101–111)
CO2: 23 mmol/L (ref 22–32)
CREATININE: 0.98 mg/dL (ref 0.61–1.24)
Calcium: 7.3 mg/dL — ABNORMAL LOW (ref 8.9–10.3)
GFR calc non Af Amer: >60 mL/min (ref >60)
Glucose, Bld: 113 mg/dL — ABNORMAL HIGH (ref 65–99)
Potassium: 4.3 mmol/L (ref 3.5–5.1)
SODIUM: 137 mmol/L (ref 135–145)

## 2015-12-17 LAB — CREATININE, SERUM
CREATININE: 1.18 mg/dL (ref 0.61–1.24)
GFR calc Af Amer: >60 mL/min (ref >60)

## 2015-12-17 LAB — MAGNESIUM
MAGNESIUM: 2.7 mg/dL — AB (ref 1.7–2.4)
Magnesium: 2.4 mg/dL (ref 1.7–2.4)

## 2015-12-17 MED ORDER — FUROSEMIDE 10 MG/ML IJ SOLN
20.0000 mg | Freq: Two times a day (BID) | INTRAMUSCULAR | Status: AC
  Administered 2015-12-17 – 2015-12-18 (×3): 20 mg via INTRAVENOUS
  Filled 2015-12-17 (×4): qty 2

## 2015-12-17 MED ORDER — INSULIN DETEMIR 100 UNIT/ML ~~LOC~~ SOLN
8.0000 [IU] | Freq: Every day | SUBCUTANEOUS | Status: DC
  Administered 2015-12-17: 8 [IU] via SUBCUTANEOUS
  Filled 2015-12-17 (×2): qty 0.08

## 2015-12-17 MED ORDER — MORPHINE SULFATE (PF) 2 MG/ML IV SOLN: 1.0000 mg | INTRAVENOUS | Status: AC | PRN

## 2015-12-17 MED ORDER — INSULIN ASPART 100 UNIT/ML ~~LOC~~ SOLN
0.0000 [IU] | SUBCUTANEOUS | Status: DC
  Administered 2015-12-17: 2 [IU] via SUBCUTANEOUS

## 2015-12-17 NOTE — Progress Notes (Signed)
CT surgery p.m. Rounds  Patient examined and record reviewed.Hemodynamics stable,labs satisfactory.Patient had stable day.Continue current care. Kathlee Nations Trigt III 12/17/2015

## 2015-12-17 NOTE — Progress Notes (Signed)
1 Day Post-Op Procedure(s) (LRB): CORONARY ARTERY BYPASS GRAFTING: LIMA to LAD, SVG to OM1 and SVG to OM2.  ENDOSCOPIC HARVEST OF GREATER SAPHENOUS VEIN (RIGHT THIGH) (N/A) INTRAOPERATIVE TRANSESOPHAGEAL ECHOCARDIOGRAM (N/A) Subjective: Doing well s/p urgent CABG for .95 L main CT drainage still sig- will leave drains ekg wnl  Objective: Vital signs in last 24 hours: Temp:  [96.1 F (35.6 C)-97.7 F (36.5 C)] 97.7 F (36.5 C) (02/19 0744) Pulse Rate:  [63-85] 82 (02/19 0700) Cardiac Rhythm:  [-] Normal sinus rhythm (02/18 2015) Resp:  [0-27] 19 (02/19 0700) BP: (85-132)/(54-84) 94/58 mmHg (02/19 0600) SpO2:  [95 %-100 %] 100 % (02/19 0700) Arterial Line BP: (94-157)/(48-84) 114/54 mmHg (02/19 0700) Weight:  [222 lb 14.2 oz (101.1 kg)] 222 lb 14.2 oz (101.1 kg) (02/19 0500)  Hemodynamic parameters for last 24 hours: PAP: (14-34)/(5-27) 26/19 mmHg CO:  [5.2 L/min-7.7 L/min] 6.2 L/min CI:  [2.4 L/min/m2-3.5 L/min/m2] 2.8 L/min/m2  Intake/Output from previous day: 02/18 0701 - 02/19 0700 In: 6426.7 [P.O.:100; I.V.:4906.7; Blood:520; IV Piggyback:900] Out: 5740 [Urine:3580; Blood:1570; Chest Tube:590] Intake/Output this shift: Total I/O In: 90.7 [I.V.:40.7; IV Piggyback:50] Out: 35 [Urine:15; Chest Tube:20]       Exam    General- alert and comfortable   Lungs- clear without rales, wheezes   Cor- regular rate and rhythm, no murmur , gallop   Abdomen- soft, non-tender   Extremities - warm, non-tender, minimal edema   Neuro- oriented, appropriate, no focal weakness   Lab Results:  Recent Labs  12/16/15 2100 12/16/15 2109 12/17/15 0400  WBC 12.6*  --  11.1*  HGB 10.2* 9.2* 10.1*  HCT 30.3* 27.0* 29.9*  PLT 154  --  149*   BMET:  Recent Labs  12/16/15 0303  12/16/15 2109 12/17/15 0400  NA 138  < > 135 137  K 4.1  < > 4.0 4.3  CL 108  < > 108 107  CO2 21*  --   --  23  GLUCOSE 86  < > 156* 113*  BUN 12  < > 8 9  CREATININE 1.15  < > 0.90 0.98  CALCIUM 8.8*   --   --  7.3*  < > = values in this interval not displayed.  PT/INR:  Recent Labs  12/16/15 1455  LABPROT 20.7*  INR 1.78*   ABG    Component Value Date/Time   PHART 7.283* 12/16/2015 2109   HCO3 22.2 12/16/2015 2109   TCO2 24 12/16/2015 2109   TCO2 24 12/16/2015 2109   ACIDBASEDEF 5.0* 12/16/2015 2109   O2SAT 90.0 12/16/2015 2109   CBG (last 3)   Recent Labs  12/16/15 2213 12/16/15 2321 12/17/15 0005  GLUCAP 152* 135* 123*    Assessment/Plan: S/P Procedure(s) (LRB): CORONARY ARTERY BYPASS GRAFTING: LIMA to LAD, SVG to OM1 and SVG to OM2.  ENDOSCOPIC HARVEST OF GREATER SAPHENOUS VEIN (RIGHT THIGH) (N/A) INTRAOPERATIVE TRANSESOPHAGEAL ECHOCARDIOGRAM (N/A) Mobilize Diuresis Diabetes control d/c tubes/lines See progression orders   LOS: 2 days    Kathlee Nations Trigt III 12/17/2015

## 2015-12-18 ENCOUNTER — Inpatient Hospital Stay (HOSPITAL_COMMUNITY): Payer: BC Managed Care – PPO

## 2015-12-18 ENCOUNTER — Encounter (HOSPITAL_COMMUNITY): Payer: Self-pay | Admitting: Cardiology

## 2015-12-18 LAB — POCT I-STAT 4, (NA,K, GLUC, HGB,HCT)
GLUCOSE: 130 mg/dL — AB (ref 65–99)
HEMATOCRIT: 33 % — AB (ref 39.0–52.0)
Hemoglobin: 11.2 g/dL — ABNORMAL LOW (ref 13.0–17.0)
POTASSIUM: 4 mmol/L (ref 3.5–5.1)
SODIUM: 139 mmol/L (ref 135–145)

## 2015-12-18 LAB — BASIC METABOLIC PANEL
Anion gap: 5 (ref 5–15)
BUN: 10 mg/dL (ref 6–20)
CO2: 26 mmol/L (ref 22–32)
Calcium: 7.5 mg/dL — ABNORMAL LOW (ref 8.9–10.3)
Chloride: 105 mmol/L (ref 101–111)
Creatinine, Ser: 1.19 mg/dL (ref 0.61–1.24)
GFR calc Af Amer: >60 mL/min (ref >60)
GFR calc non Af Amer: >60 mL/min (ref >60)
Glucose, Bld: 95 mg/dL (ref 65–99)
Potassium: 3.8 mmol/L (ref 3.5–5.1)
Sodium: 136 mmol/L (ref 135–145)

## 2015-12-18 LAB — GLUCOSE, CAPILLARY
GLUCOSE-CAPILLARY: 93 mg/dL (ref 65–99)
Glucose-Capillary: 90 mg/dL (ref 65–99)

## 2015-12-18 LAB — CBC
HCT: 28 % — ABNORMAL LOW (ref 39.0–52.0)
Hemoglobin: 9.3 g/dL — ABNORMAL LOW (ref 13.0–17.0)
MCH: 27.9 pg (ref 26.0–34.0)
MCHC: 33.2 g/dL (ref 30.0–36.0)
MCV: 84.1 fL (ref 78.0–100.0)
Platelets: 149 10*3/uL — ABNORMAL LOW (ref 150–400)
RBC: 3.33 MIL/uL — ABNORMAL LOW (ref 4.22–5.81)
RDW: 13 % (ref 11.5–15.5)
WBC: 8.8 10*3/uL (ref 4.0–10.5)

## 2015-12-18 LAB — HEMOGLOBIN A1C
Hgb A1c MFr Bld: 5.2 % (ref 4.8–5.6)
Mean Plasma Glucose: 103 mg/dL

## 2015-12-18 MED ORDER — POTASSIUM CHLORIDE CRYS ER 20 MEQ PO TBCR
20.0000 meq | EXTENDED_RELEASE_TABLET | Freq: Every day | ORAL | Status: DC
  Administered 2015-12-19 – 2015-12-20 (×2): 20 meq via ORAL
  Filled 2015-12-18 (×2): qty 1

## 2015-12-18 MED ORDER — SODIUM CHLORIDE 0.9 % IV SOLN: 250.0000 mL | INTRAVENOUS | Status: DC | PRN

## 2015-12-18 MED ORDER — SODIUM CHLORIDE 0.9% FLUSH: 3.0000 mL | INTRAVENOUS | Status: DC | PRN

## 2015-12-18 MED ORDER — SODIUM CHLORIDE 0.9% FLUSH
3.0000 mL | Freq: Two times a day (BID) | INTRAVENOUS | Status: DC
  Administered 2015-12-18 – 2015-12-19 (×4): 3 mL via INTRAVENOUS

## 2015-12-18 MED ORDER — POTASSIUM CHLORIDE 10 MEQ/50ML IV SOLN
10.0000 meq | INTRAVENOUS | Status: AC
Start: 2015-12-18 — End: 2015-12-18
  Administered 2015-12-18 (×2): 10 meq via INTRAVENOUS
  Filled 2015-12-18 (×2): qty 50

## 2015-12-18 MED ORDER — MOVING RIGHT ALONG BOOK
Freq: Once | Status: AC
  Administered 2015-12-18: 09:00:00
  Filled 2015-12-18: qty 1

## 2015-12-18 MED ORDER — MAGNESIUM HYDROXIDE 400 MG/5ML PO SUSP: 30.0000 mL | Freq: Every day | ORAL | Status: DC | PRN

## 2015-12-18 MED ORDER — FUROSEMIDE 40 MG PO TABS
40.0000 mg | ORAL_TABLET | Freq: Every day | ORAL | Status: DC
  Administered 2015-12-19 – 2015-12-20 (×2): 40 mg via ORAL
  Filled 2015-12-18 (×2): qty 1

## 2015-12-18 MED FILL — Lidocaine HCl IV Inj 20 MG/ML: INTRAVENOUS | Qty: 5 | Status: AC

## 2015-12-18 MED FILL — Electrolyte-R (PH 7.4) Solution: INTRAVENOUS | Qty: 5000 | Status: AC

## 2015-12-18 MED FILL — Magnesium Sulfate Inj 50%: INTRAMUSCULAR | Qty: 10 | Status: AC

## 2015-12-18 MED FILL — Heparin Sodium (Porcine) Inj 1000 Unit/ML: INTRAMUSCULAR | Qty: 30 | Status: AC

## 2015-12-18 MED FILL — Sodium Bicarbonate IV Soln 8.4%: INTRAVENOUS | Qty: 50 | Status: AC

## 2015-12-18 MED FILL — Mannitol IV Soln 20%: INTRAVENOUS | Qty: 500 | Status: AC

## 2015-12-18 MED FILL — Sodium Chloride IV Soln 0.9%: INTRAVENOUS | Qty: 2000 | Status: AC

## 2015-12-18 MED FILL — Potassium Chloride Inj 2 mEq/ML: INTRAVENOUS | Qty: 40 | Status: AC

## 2015-12-18 MED FILL — Heparin Sodium (Porcine) Inj 1000 Unit/ML: INTRAMUSCULAR | Qty: 10 | Status: AC

## 2015-12-18 NOTE — Progress Notes (Signed)
Subjective:  Tolerated surgery well.  Not SOB.  No chest pain of significance. Chest tube to come out later. Weight up 9 pounds.  Mild apnea noted last night.   Objective:  Vital Signs in the last 24 hours: BP 111/70 mmHg  Pulse 87  Temp(Src) 98.4 F (36.9 C) (Oral)  Resp 16  Ht 6' (1.829 m)  Wt 100.6 kg (221 lb 12.5 oz)  BMI 30.07 kg/m2  SpO2 96%  Physical Exam: Pleasant WM in NAD Lungs:  Reduced BS Cardiac:  Regular rhythm, normal S1 and S2, no S3 Extremities:  No edema present  Intake/Output from previous day: 02/19 0701 - 02/20 0700 In: 1495.9 [P.O.:1140; I.V.:255.9; IV Piggyback:100] Out: 1550 [Urine:1050; Chest Tube:500] Weight Filed Weights   12/16/15 0400 12/17/15 0500 12/18/15 0600  Weight: 96.6 kg (212 lb 15.4 oz) 101.1 kg (222 lb 14.2 oz) 100.6 kg (221 lb 12.5 oz)    Lab Results: Basic Metabolic Panel:  Recent Labs  09/81/19 0400  12/17/15 1723 12/18/15 0400  NA 137  --  137 136  K 4.3  --  3.9 3.8  CL 107  --  101 105  CO2 23  --   --  26  GLUCOSE 113*  --  108* 95  BUN 9  --  10 10  CREATININE 0.98  < > 1.00 1.19  < > = values in this interval not displayed.  CBC:  Recent Labs  12/17/15 1722 12/17/15 1723 12/18/15 0400  WBC 10.3  --  8.8  HGB 10.1* 10.5* 9.3*  HCT 30.0* 31.0* 28.0*  MCV 84.0  --  84.1  PLT 183  --  149*    Telemetry: sinus  Assessment/Plan:  1. Good progress following CABG at present.  Rec:  Needs diuresis and progression when chest tube out.  Doing quite well.   Darden Palmer  MD [facility]  12/18/2015, 9:18 AM

## 2015-12-18 NOTE — Progress Notes (Signed)
2 Days Post-Op Procedure(s) (LRB): CORONARY ARTERY BYPASS GRAFTING: LIMA to LAD, SVG to OM1 and SVG to OM2.  ENDOSCOPIC HARVEST OF GREATER SAPHENOUS VEIN (RIGHT THIGH) (N/A) INTRAOPERATIVE TRANSESOPHAGEAL ECHOCARDIOGRAM (N/A) Subjective: ready to move to stepdown MT drainage still significant- will pull later  Objective: Vital signs in last 24 hours: Temp:  [97.4 F (36.3 C)-99.2 F (37.3 C)] 98.4 F (36.9 C) (02/20 1338) Pulse Rate:  [78-93] 81 (02/20 1338) Cardiac Rhythm:  [-] Normal sinus rhythm (02/20 1323) Resp:  [13-20] 20 (02/20 1338) BP: (92-125)/(52-70) 117/68 mmHg (02/20 1338) SpO2:  [92 %-100 %] 98 % (02/20 1338) Weight:  [221 lb 12.5 oz (100.6 kg)] 221 lb 12.5 oz (100.6 kg) (02/20 0600)  Hemodynamic parameters for last 24 hours:  nsr  Intake/Output from previous day: 02/19 0701 - 02/20 0700 In: 1495.9 [P.O.:1140; I.V.:255.9; IV Piggyback:100] Out: 1550 [Urine:1050; Chest Tube:500] Intake/Output this shift: Total I/O In: 240 [P.O.:240] Out: 1260 [Urine:1200; Chest Tube:60]       Exam    General- alert and comfortable   Lungs- clear without rales, wheezes   Cor- regular rate and rhythm, no murmur , gallop   Abdomen- soft, non-tender   Extremities - warm, non-tender, minimal edema   Neuro- oriented, appropriate, no focal weakness   Lab Results:  Recent Labs  12/17/15 1722 12/17/15 1723 12/18/15 0400  WBC 10.3  --  8.8  HGB 10.1* 10.5* 9.3*  HCT 30.0* 31.0* 28.0*  PLT 183  --  149*   BMET:  Recent Labs  12/17/15 0400  12/17/15 1723 12/18/15 0400  NA 137  --  137 136  K 4.3  --  3.9 3.8  CL 107  --  101 105  CO2 23  --   --  26  GLUCOSE 113*  --  108* 95  BUN 9  --  10 10  CREATININE 0.98  < > 1.00 1.19  CALCIUM 7.3*  --   --  7.5*  < > = values in this interval not displayed.  PT/INR:  Recent Labs  12/16/15 1455  LABPROT 20.7*  INR 1.78*   ABG    Component Value Date/Time   PHART 7.283* 12/16/2015 2109   HCO3 22.2 12/16/2015  2109   TCO2 24 12/17/2015 1723   ACIDBASEDEF 5.0* 12/16/2015 2109   O2SAT 90.0 12/16/2015 2109   CBG (last 3)   Recent Labs  12/17/15 2345 12/18/15 0324 12/18/15 0737  GLUCAP 81 93 90    Assessment/Plan: S/P Procedure(s) (LRB): CORONARY ARTERY BYPASS GRAFTING: LIMA to LAD, SVG to OM1 and SVG to OM2.  ENDOSCOPIC HARVEST OF GREATER SAPHENOUS VEIN (RIGHT THIGH) (N/A) INTRAOPERATIVE TRANSESOPHAGEAL ECHOCARDIOGRAM (N/A) Mobilize Diuresis Plan for transfer to step-down: see transfer orders   LOS: 3 days    Adam Taylor 12/18/2015

## 2015-12-18 NOTE — Progress Notes (Signed)
ICU UR Completed. Jiles Goya, RN, BSN.  [phone]  

## 2015-12-18 NOTE — Clinical Documentation Improvement (Signed)
Cardiothoracic  Abnormal Lab/Test Results:  H/H: 02/18:  11.3/33.7 02/20:   9.3/28.0  Possible Clinical Conditions associated with below indicators  Acute Blood Loss Anemia  Acute on Chronic Blood Loss Anemia  Other Condition  Cannot Clinically Determine   Supporting Information: Per 12/16/15 Anesthesia record: EBL: 1570 ml. Cell saver: 520 ml.  Albumin 250 ml per 12/16/15 flowsheets.   Please exercise your independent, professional judgment when responding. A specific answer is not anticipated or expected.   Thank Sabino Donovan [employer] [phone]

## 2015-12-18 NOTE — Progress Notes (Signed)
1410 Went to see to offer walk. Pt has walked twice already. Stated he walked over earlier. Will walk again prior to bedtime. Will follow up tomorrow. Luetta Nutting RN BSN 12/18/2015 2:11 PM

## 2015-12-18 NOTE — Progress Notes (Signed)
Pt refused CPAP for the night. Will monotor

## 2015-12-19 ENCOUNTER — Inpatient Hospital Stay (HOSPITAL_COMMUNITY): Payer: BC Managed Care – PPO

## 2015-12-19 LAB — BASIC METABOLIC PANEL WITH GFR
Anion gap: 5 (ref 5–15)
BUN: 10 mg/dL (ref 6–20)
CO2: 27 mmol/L (ref 22–32)
Calcium: 8.2 mg/dL — ABNORMAL LOW (ref 8.9–10.3)
Chloride: 105 mmol/L (ref 101–111)
Creatinine, Ser: 1.21 mg/dL (ref 0.61–1.24)
GFR calc Af Amer: >60 mL/min
GFR calc non Af Amer: >60 mL/min
Glucose, Bld: 105 mg/dL — ABNORMAL HIGH (ref 65–99)
Potassium: 4.6 mmol/L (ref 3.5–5.1)
Sodium: 137 mmol/L (ref 135–145)

## 2015-12-19 LAB — CBC
HCT: 28.3 % — ABNORMAL LOW (ref 39.0–52.0)
Hemoglobin: 9.2 g/dL — ABNORMAL LOW (ref 13.0–17.0)
MCH: 27.5 pg (ref 26.0–34.0)
MCHC: 32.5 g/dL (ref 30.0–36.0)
MCV: 84.7 fL (ref 78.0–100.0)
Platelets: 155 10*3/uL (ref 150–400)
RBC: 3.34 MIL/uL — ABNORMAL LOW (ref 4.22–5.81)
RDW: 12.9 % (ref 11.5–15.5)
WBC: 9 10*3/uL (ref 4.0–10.5)

## 2015-12-19 LAB — GLUCOSE, CAPILLARY: GLUCOSE-CAPILLARY: 91 mg/dL (ref 65–99)

## 2015-12-19 MED ORDER — LISINOPRIL 5 MG PO TABS
5.0000 mg | ORAL_TABLET | Freq: Every day | ORAL | Status: DC
  Administered 2015-12-19 – 2015-12-20 (×2): 5 mg via ORAL
  Filled 2015-12-19 (×2): qty 1

## 2015-12-19 NOTE — Progress Notes (Signed)
Patient anticipating going home tomorrow and wants to see if epicardial wires can be taken out early. Wife says it's her birthday and she wants her man home with her. Awww. Thomas Hoff, RN

## 2015-12-19 NOTE — Progress Notes (Signed)
CARDIAC REHAB PHASE I   PRE:  Rate/Rhythm: 87 SR  BP:  Supine:   Sitting:   Standing:    SaO2:  Up in hall  MODE:  Ambulation: 890 ft   POST:  Rate/Rhythm: 93 SR  BP:  Supine:   Sitting: 129/69  Standing:    SaO2: 100%RA 1340-1425 Pt up and ready for walk. Walked 890 ft with steady gait. No walker needed. Tolerated well. Education completed with pt and wife who voiced understanding. Discussed CRP 2 and pt gave permission for referral to [place]. Wrote down discharge video number to view.    Luetta Nutting, RN BSN  12/19/2015 2:21 PM

## 2015-12-19 NOTE — Progress Notes (Signed)
Pt refused cpap for the night, hasn't needed or wore CPAP since the night of surgery. Will cont to monitor.

## 2015-12-19 NOTE — Research (Signed)
CADLAD Informed Consent   Subject Name: Adam Taylor  Subject met inclusion and exclusion criteria.  The informed consent form, study requirements and expectations were reviewed with the subject and questions and concerns were addressed prior to the signing of the consent form.  The subject verbalized understanding of the trial requirements.  The subject agreed to participate in the CADLAD trial and signed the informed consent.  The informed consent was obtained prior to performance of any protocol-specific procedures for the subject.  A copy of the signed informed consent was given to the subject and a copy was placed in the subject's medical record.  Blossom Hoops 12/19/2015, 9:02 AM

## 2015-12-19 NOTE — Progress Notes (Signed)
Removed mediastinal chest tube and placed occlusive dressing.  Called radiology for portable 1 view post removal. Removed Aquacel dressing and painted incision with betadine and left open to air per order. Pt resting with call bell within reach.  Will continue to monitor. Thomas Hoff, RN

## 2015-12-19 NOTE — Progress Notes (Addendum)
[address]       [place] [address]             [phone]      3 Days Post-Op Procedure(s) (LRB): CORONARY ARTERY BYPASS GRAFTING: LIMA to LAD, SVG to OM1 and SVG to OM2.  ENDOSCOPIC HARVEST OF GREATER SAPHENOUS VEIN (RIGHT THIGH) (N/A) INTRAOPERATIVE TRANSESOPHAGEAL ECHOCARDIOGRAM (N/A) Subjective: Looks and feels well  Objective: Vital signs in last 24 hours: Temp:  [98.1 F (36.7 C)-98.9 F (37.2 C)] 98.1 F (36.7 C) (02/21 0417) Pulse Rate:  [75-93] 75 (02/21 0417) Cardiac Rhythm:  [-] Normal sinus rhythm (02/21 0417) Resp:  [13-20] 18 (02/21 0417) BP: (113-126)/(51-68) 113/52 mmHg (02/21 0417) SpO2:  [96 %-98 %] 98 % (02/21 0417) Weight:  [221 lb 1.9 oz (100.3 kg)] 221 lb 1.9 oz (100.3 kg) (02/21 0547)  Hemodynamic parameters for last 24 hours:    Intake/Output from previous day: 02/20 0701 - 02/21 0700 In: 480 [P.O.:480] Out: 1940 [Urine:1800; Chest Tube:140] Intake/Output this shift:    General appearance: alert, cooperative and no distress Heart: regular rate and rhythm Lungs: clear to auscultation bilaterally Abdomen: benign Extremities: no edema Wound: incis healing well(EVH), chest dressing intact  Lab Results:  Recent Labs  12/18/15 0400 12/19/15 0242  WBC 8.8 9.0  HGB 9.3* 9.2*  HCT 28.0* 28.3*  PLT 149* 155   BMET:  Recent Labs  12/18/15 0400 12/19/15 0242  NA 136 137  K 3.8 4.6  CL 105 105  CO2 26 27  GLUCOSE 95 105*  BUN 10 10  CREATININE 1.19 1.21  CALCIUM 7.5* 8.2*    PT/INR:  Recent Labs  12/16/15 1455  LABPROT 20.7*  INR 1.78*   ABG    Component Value Date/Time   PHART 7.283* 12/16/2015 2109   HCO3 22.2 12/16/2015 2109   TCO2 24 12/17/2015 1723   ACIDBASEDEF 5.0* 12/16/2015 2109   O2SAT 90.0 12/16/2015 2109   CBG (last 3)   Recent Labs  12/18/15 0324 12/18/15 0737 12/19/15 0705  GLUCAP 93 90 91    Meds Scheduled Meds: . acetaminophen  1,000 mg Oral 4 times per day   Or  .  acetaminophen (TYLENOL) oral liquid 160 mg/5 mL  1,000 mg Per Tube 4 times per day  . antiseptic oral rinse  7 mL Mouth Rinse q12n4p  . aspirin EC  325 mg Oral Daily   Or  . aspirin  324 mg Per Tube Daily  . atorvastatin  80 mg Oral q1800  . bisacodyl  10 mg Oral Daily   Or  . bisacodyl  10 mg Rectal Daily  . chlorhexidine  15 mL Mouth Rinse BID  . docusate sodium  200 mg Oral Daily  . furosemide  40 mg Oral Daily  . metoprolol tartrate  12.5 mg Oral BID  . pantoprazole  40 mg Oral Daily  . potassium chloride  20 mEq Oral Daily  . sodium chloride flush  3 mL Intravenous Q12H  . sodium chloride flush  3 mL Intravenous Q12H   Continuous Infusions: . sodium chloride Stopped (12/17/15 0900)  . lactated ringers Stopped (12/17/15 1700)   PRN Meds:.sodium chloride, sodium chloride, magnesium hydroxide, metoprolol, ondansetron (ZOFRAN) IV, oxyCODONE, sodium chloride flush, sodium chloride flush, traMADol  Xrays Dg Chest Port 1 View  12/18/2015  CLINICAL DATA:  50 year old male post CABG.  Subsequent encounter. EXAM: PORTABLE CHEST 1 VIEW COMPARISON:  12/17/2015. FINDINGS: Right Swan-Ganz catheter has been removed. Introducer  tip projects at the level of the right internal jugular vein/ brachiocephalic vein junction. Left-sided chest tube remains in place with tiny left apical pneumothorax. Mild bibasilar subsegmental atelectasis. Post CABG.  Cardiomegaly. Prior cervical spine surgery. Erosion distal right clavicle with slight elevation. IMPRESSION: Left-sided chest tube remains in place with tiny left apical pneumothorax. Post CABG.  Cardiomegaly. Bibasilar subsegmental atelectasis. Swan-Ganz catheter has been removed. Electronically Signed   By: Lacy Duverney M.D.   On: 12/18/2015 07:25    Assessment/Plan: S/P Procedure(s) (LRB): CORONARY ARTERY BYPASS GRAFTING: LIMA to LAD, SVG to OM1 and SVG to OM2.  ENDOSCOPIC HARVEST OF GREATER SAPHENOUS VEIN (RIGHT THIGH) (N/A) INTRAOPERATIVE  TRANSESOPHAGEAL ECHOCARDIOGRAM (N/A)  1 doing very well 2 chest tube drainage decreased- d/c tube 3 H/H stable 4 renal fxn stable- cont gentle diuresis, start low dose ace inhib 5 routine rehab/pulm toilet  LOS: 4 days    Adam Taylor,Adam Taylor 12/19/2015  patient examined and medical record reviewed,agree with above note. Kathlee Nations Trigt III 12/19/2015

## 2015-12-19 NOTE — Progress Notes (Signed)
Utilization review completed.  

## 2015-12-19 NOTE — Discharge Summary (Signed)
Physician Discharge Summary  Patient ID: Adam Taylor MRN: [id number] DOB/AGE: 06-10-1966 50 y.o.  Admit date: 12/15/2015 Discharge date: 12/20/2015  Admission Diagnoses:  Patient Active Problem List   Diagnosis Date Noted  . CAD (coronary artery disease), native coronary artery 12/15/2015  . Unstable angina (HCC) 12/14/2015  . Elevated blood pressure 12/14/2015  . Hyperlipidemia    Discharge Diagnoses:   Patient Active Problem List   Diagnosis Date Noted  . S/P CABG x 3 12/16/2015  . CAD (coronary artery disease), native coronary artery 12/15/2015  . Unstable angina (HCC) 12/14/2015  . Elevated blood pressure 12/14/2015  . Hyperlipidemia    Discharged Condition: good  History of Present Illness:  Adam Taylor is a 50 yo male who was evaluated by Dr. Donnie Aho for chest discomfort.  The patient developed some mid-sternal burning type chest pain while running on a treadmill.  He transitioned into a walk and the chest pain resolved.  He had a similar episode after taking the trash cans out.  This pain was more intense and he felt weakened when it occurred.  This may have been associated with some mild diaphoresis.  On day of evaluation he again developed mid-sternal burning type chest pain which was associated with shortness of breath and weakness.  This did not completely resolve, and they patient felt he had "nagging" sensation in his chest.  He underwent a treadmill stress test which showed some ST changes accompanied by chest tightness which resolved relatively quickly and the test was terminated.  It was recommended the patient undergo cardiac catheterization.  The patient was agreeable to proceed and this was arranged for the next day 12/15/2015.  This was performed by Dr. [place] and showed a 99 % LM stenosis.  It was felt coronary bypass grafting would be indicated and TCTS consult was obtained.   Hospital Course:   He was evaluated by Dr. Donata Clay who was in agreement CABG  would be indicated.  The risks and benefits of the procedure were explained to the patient and he was agreeable to proceed.  He remained chest pain free prior to surgery.  He was taken to the operating room on 12/16/2015.  He underwent CABG x 3 utilizing LIMA to LAD, SVG to OM1 and SVG to OM2.  He also underwent endoscopic harvest of the greater saphenous vein from the right thigh.  He tolerated the procedure without difficulty, was extubated, and was taken to the SICU in stable condition. The patient was treated with CPAP for apnea.  The patient was weaned off all drips.  His chest tubes and arterial lines were removed without difficulty.  He was maintaining NSR and felt stable for transfer to the telemetry unit for further care.  The patient continues to progress.  He continues to maintain NSR and his pacing wires were removed without difficulty.  He was started on a low dose ACE for HTN.          Significant Diagnostic Studies:   Cardiac graphics:    Ost LM lesion, 99% stenosed.  The left ventricular systolic function is normal.  1. Critical ostial Left main stenosis.  2. Good LV function  Treatments: surgery:   1. Coronary artery bypass grafting x3 (left internal mammary artery to LAD, saphenous vein graft to OM1, saphenous vein graft to OM2). 2. Endoscopic harvest of right leg greater saphenous vein.  Disposition: Home  Discharge Medications:  The patient has been discharged on:   1.Beta Blocker:  Yes [  x  ]                              No   [   ]                              If No, reason:  2.Ace Inhibitor/ARB: Yes [ x  ]                                     No  [    ]                                     If No, reason:  3.Statin:   Yes [x   ]                  No  [   ]                  If No, reason:  4.Ecasa:  Yes  [  x ]                  No   [   ]                  If No, reason:      Discharge Instructions    Amb Referral to Cardiac Rehabilitation    Complete by:   As directed   Diagnosis:  CABG            Medication List    STOP taking these medications        nitroGLYCERIN 0.4 MG SL tablet  Commonly known as:  NITROSTAT      TAKE these medications        aspirin 325 MG EC tablet  Take 1 tablet (325 mg total) by mouth daily.     atorvastatin 80 MG tablet  Commonly known as:  LIPITOR  Take 1 tablet (80 mg total) by mouth daily at 6 PM.     lisinopril 5 MG tablet  Commonly known as:  PRINIVIL,ZESTRIL  Take 1 tablet (5 mg total) by mouth daily.     metoprolol tartrate 25 MG tablet  Commonly known as:  LOPRESSOR  Take 0.5 tablets (12.5 mg total) by mouth 2 (two) times daily.     omeprazole 20 MG capsule  Commonly known as:  PRILOSEC  Take 20 mg by mouth daily as needed.     oxyCODONE 5 MG immediate release tablet  Commonly known as:  Oxy IR/ROXICODONE  Take 1-2 tablets (5-10 mg total) by mouth every 3 (three) hours as needed for severe pain.       Follow-up Information    Follow up with Mikey Bussing, MD.   Specialty:  Cardiothoracic Surgery   Why:  01/22/16 at 2 pm to see surgeon. Please obtain a chest xray at 1:30 pm at [facility]. [facility] is located in the same office complex   Contact information:   [address] [place] [address] [phone]       Follow up with [facility].   Contact information:   [place]       Follow up with  Georga Hacking, MD.   Specialty:  Cardiology   Why:  Please contact office to set up follow up appointment   Contact information:   [address] [place] [address] [phone]       Signed: Rowe Clack 12/20/2015, 7:55 AM

## 2015-12-20 MED ORDER — LISINOPRIL 5 MG PO TABS: 5.0000 mg | ORAL_TABLET | Freq: Every day | ORAL | Status: AC

## 2015-12-20 MED ORDER — ATORVASTATIN CALCIUM 80 MG PO TABS: 80.0000 mg | ORAL_TABLET | Freq: Every day | ORAL | Status: AC

## 2015-12-20 MED ORDER — METOPROLOL TARTRATE 25 MG PO TABS: 12.5000 mg | ORAL_TABLET | Freq: Two times a day (BID) | ORAL | Status: AC

## 2015-12-20 MED ORDER — ASPIRIN 325 MG PO TBEC: 325.0000 mg | DELAYED_RELEASE_TABLET | Freq: Every day | ORAL | Status: AC

## 2015-12-20 MED ORDER — OXYCODONE HCL 5 MG PO TABS: 5.0000 mg | ORAL_TABLET | ORAL | Status: DC | PRN

## 2015-12-20 NOTE — Progress Notes (Addendum)
[address]       [place] [address]             [phone]      4 Days Post-Op Procedure(s) (LRB): CORONARY ARTERY BYPASS GRAFTING: LIMA to LAD, SVG to OM1 and SVG to OM2.  ENDOSCOPIC HARVEST OF GREATER SAPHENOUS VEIN (RIGHT THIGH) (N/A) INTRAOPERATIVE TRANSESOPHAGEAL ECHOCARDIOGRAM (N/A) Subjective: Feels well  Objective: Vital signs in last 24 hours: Temp:  [98.4 F (36.9 C)-99.5 F (37.5 C)] 98.4 F (36.9 C) (02/22 0517) Pulse Rate:  [76-87] 76 (02/22 0517) Cardiac Rhythm:  [-] Normal sinus rhythm (02/21 2200) Resp:  [18-20] 20 (02/22 0517) BP: (117-133)/(56-64) 117/56 mmHg (02/22 0517) SpO2:  [98 %-99 %] 98 % (02/22 0517) Weight:  [217 lb 14.4 oz (98.839 kg)] 217 lb 14.4 oz (98.839 kg) (02/22 0517)  Hemodynamic parameters for last 24 hours:    Intake/Output from previous day: 02/21 0701 - 02/22 0700 In: 960 [P.O.:960] Out: -  Intake/Output this shift:    General appearance: alert, cooperative and no distress Heart: regular rate and rhythm Lungs: min dim in bases Abdomen: benign Extremities: trace edema Wound: incis healing well  Lab Results:  Recent Labs  12/18/15 0400 12/19/15 0242  WBC 8.8 9.0  HGB 9.3* 9.2*  HCT 28.0* 28.3*  PLT 149* 155   BMET:  Recent Labs  12/18/15 0400 12/19/15 0242  NA 136 137  K 3.8 4.6  CL 105 105  CO2 26 27  GLUCOSE 95 105*  BUN 10 10  CREATININE 1.19 1.21  CALCIUM 7.5* 8.2*    PT/INR: No results for input(s): LABPROT, INR in the last 72 hours. ABG    Component Value Date/Time   PHART 7.283* 12/16/2015 2109   HCO3 22.2 12/16/2015 2109   TCO2 24 12/17/2015 1723   ACIDBASEDEF 5.0* 12/16/2015 2109   O2SAT 90.0 12/16/2015 2109   CBG (last 3)   Recent Labs  12/18/15 0324 12/18/15 0737 12/19/15 0705  GLUCAP 93 90 91    Meds Scheduled Meds: . acetaminophen  1,000 mg Oral 4 times per day   Or  . acetaminophen (TYLENOL) oral liquid 160 mg/5 mL  1,000 mg Per Tube 4 times per  day  . antiseptic oral rinse  7 mL Mouth Rinse q12n4p  . aspirin EC  325 mg Oral Daily   Or  . aspirin  324 mg Per Tube Daily  . atorvastatin  80 mg Oral q1800  . bisacodyl  10 mg Oral Daily   Or  . bisacodyl  10 mg Rectal Daily  . chlorhexidine  15 mL Mouth Rinse BID  . docusate sodium  200 mg Oral Daily  . furosemide  40 mg Oral Daily  . lisinopril  5 mg Oral Daily  . metoprolol tartrate  12.5 mg Oral BID  . pantoprazole  40 mg Oral Daily  . potassium chloride  20 mEq Oral Daily  . sodium chloride flush  3 mL Intravenous Q12H   Continuous Infusions: . lactated ringers Stopped (12/17/15 1700)   PRN Meds:.magnesium hydroxide, metoprolol, ondansetron (ZOFRAN) IV, oxyCODONE, sodium chloride flush, traMADol  Xrays Dg Chest 2 View  12/19/2015  CLINICAL DATA:  CABG EXAM: CHEST  2 VIEW COMPARISON:  12/18/2015 FINDINGS: Left chest tube has been removed. Small left apical pneumothorax slightly larger. I Small bilateral pleural effusions with mild bibasilar atelectasis. Negative for pulmonary edema. IMPRESSION: Small left apical pneumothorax following left chest tube removal. Mild bibasilar atelectasis and small  pleural effusions. Electronically Signed   By: Marlan Palau M.D.   On: 12/19/2015 07:32   Dg Chest Port 1 View  12/19/2015  CLINICAL DATA:  Chest tube removed.  Follow-up pneumothorax. EXAM: PORTABLE CHEST 1 VIEW COMPARISON:  Chest radiograph 12/19/2015 at 5:51 hours. Chest radiograph 12/18/2015 and chest radiograph 12/15/2015 FINDINGS: There are median sternotomy wires for CABG. Epicardial pacer wires noted. A small left apical pneumothorax persists. The pleural line projects between the second and third posterior ribs. No significant change in the pneumothorax compared to the chest radiograph performed earlier today at 5:51 a.m. Otherwise, the left lung is unremarkable. The right lung is well expanded and clear. IMPRESSION: No significant change in small left apical pneumothorax.  Electronically Signed   By: Britta Mccreedy M.D.   On: 12/19/2015 13:01    Assessment/Plan: S/P Procedure(s) (LRB): CORONARY ARTERY BYPASS GRAFTING: LIMA to LAD, SVG to OM1 and SVG to OM2.  ENDOSCOPIC HARVEST OF GREATER SAPHENOUS VEIN (RIGHT THIGH) (N/A) INTRAOPERATIVE TRANSESOPHAGEAL ECHOCARDIOGRAM (N/A)  Doing well D/C EPW's  Stable for d/c if no issues present after wires are removed   LOS: 5 days    GOLD,WAYNE E 12/20/2015  Agree with plans for discharge today Discharge instructions reviewed patient  patient examined and medical record reviewed,agree with above note. Kathlee Nations Trigt III 12/20/2015

## 2015-12-20 NOTE — Progress Notes (Signed)
Discussed with the patient and his wife and all questioned fully answered. He will call me if any problems arise.   Regnia Mathwig S [place], RN   

## 2015-12-20 NOTE — Care Management Note (Signed)
Case Management Note Donn Pierini RN, BSN Unit 2W-Case Manager [phone]  Patient Details  Name: Adam Taylor MRN: [id number] Date of Birth: 25-May-1966  Subjective/Objective:  Pt admitted from Cath lab s/p CABG                  Action/Plan: PTA pt lived at home- plan to return home- no CM needs noted  Expected Discharge Date:    12/20/15              Expected Discharge Plan:  Home/Self Care  In-House Referral:     Discharge planning Services  CM Consult  Post Acute Care Choice:    Choice offered to:     DME Arranged:    DME Agency:     HH Arranged:    HH Agency:     Status of Service:  Completed, signed off  Medicare Important Message Given:    Date Medicare IM Given:    Medicare IM give by:    Date Additional Medicare IM Given:    Additional Medicare Important Message give by:     If discussed at Long Length of Stay Meetings, dates discussed:    Discharge Disposition: Home/self care   Additional Comments:  Darrold Span, RN 12/20/2015, 11:06 AM

## 2015-12-20 NOTE — Progress Notes (Signed)
Pacing wires removed at 0855. Pt tolerated well. One hour bed rest completed at 0955. Will continue to monitor.   Leonidas Romberg, RN

## 2015-12-20 NOTE — Discharge Instructions (Signed)
Coronary Artery Bypass Grafting, Care After Refer to this sheet in the next few weeks. These instructions provide you with information on caring for yourself after your procedure. Your health care provider may also give you more specific instructions. Your treatment has been planned according to current medical practices, but problems sometimes occur. Call your health care provider if you have any problems or questions after your procedure. WHAT TO EXPECT AFTER THE PROCEDURE Recovery from surgery will be different for everyone. Some people feel well after 3 or 4 weeks, while for others it takes longer. After your procedure, it is typical to have the following:  Nausea and a lack of appetite.   Constipation.  Weakness and fatigue.   Depression or irritability.   Pain or discomfort at your incision site. HOME CARE INSTRUCTIONS  Take medicines only as directed by your health care provider. Do not stop taking medicines or start any new medicines without first checking with your health care provider.  Take your pulse as directed by your health care provider.  Perform deep breathing as directed by your health care provider. If you were given a device called an incentive spirometer, use it to practice deep breathing several times a day. Support your chest with a pillow or your arms when you take deep breaths or cough.  Keep incision areas clean, dry, and protected. Remove or change any bandages (dressings) only as directed by your health care provider. You may have skin adhesive strips over the incision areas. Do not take the strips off. They will fall off on their own.  Check incision areas daily for any swelling, redness, or drainage.  If incisions were made in your legs, do the following:  Avoid crossing your legs.   Avoid sitting for long periods of time. Change positions every 30 minutes.   Elevate your legs when you are sitting.  Wear compression stockings as directed by your  health care provider. These stockings help keep blood clots from forming in your legs.  Take showers once your health care provider approves. Until then, only take sponge baths. Pat incisions dry. Do not rub incisions with a washcloth or towel. Do not take baths, swim, or use a hot tub until your health care provider approves.  Eat foods that are high in fiber, such as raw fruits and vegetables, whole grains, beans, and nuts. Meats should be lean cut. Avoid canned, processed, and fried foods.  Drink enough fluid to keep your urine clear or pale yellow.  Weigh yourself every day. This helps identify if you are retaining fluid that may make your heart and lungs work harder.  Rest and limit activity as directed by your health care provider. You may be instructed to:  Stop any activity at once if you have chest pain, shortness of breath, irregular heartbeats, or dizziness. Get help right away if you have any of these symptoms.  Move around frequently for short periods or take short walks as directed by your health care provider. Increase your activities gradually. You may need physical therapy or cardiac rehabilitation to help strengthen your muscles and build your endurance.  Avoid lifting, pushing, or pulling anything heavier than 10 lb (4.5 kg) for at least 6 weeks after surgery.  Do not drive until your health care provider approves.  Ask your health care provider when you may return to work.  Ask your health care provider when you may resume sexual activity.  Keep all follow-up visits as directed by your health care  provider. This is important. °SEEK MEDICAL CARE IF: °· You have swelling, redness, increasing pain, or drainage at the site of an incision. °· You have a fever. °· You have swelling in your ankles or legs. °· You have pain in your legs.   °· You gain 2 or more pounds (0.9 kg) a day. °· You are nauseous or vomit. °· You have diarrhea.  °SEEK IMMEDIATE MEDICAL CARE IF: °· You have  chest pain that goes to your jaw or arms. °· You have shortness of breath.   °· You have a fast or irregular heartbeat.   °· You notice a "clicking" in your breastbone (sternum) when you move.   °· You have numbness or weakness in your arms or legs. °· You feel dizzy or light-headed.   °MAKE SURE YOU: °· Understand these instructions. °· Will watch your condition. °· Will get help right away if you are not doing well or get worse. °  °This information is not intended to replace advice given to you by your health care provider. Make sure you discuss any questions you have with your health care provider. °  °Document Released: 05/03/2005 Document Revised: 11/04/2014 Document Reviewed: 03/23/2013 °Elsevier Interactive Patient Education ©2016 [employer] ° °Endoscopic Saphenous Vein Harvesting, Care After °Refer to this sheet in the next few weeks. These instructions provide you with information on caring for yourself after your procedure. Your health care provider may also give you more specific instructions. Your treatment has been planned according to current medical practices, but problems sometimes occur. Call your health care provider if you have any problems or questions after your procedure. °HOME CARE INSTRUCTIONS °Medicine °· Take whatever pain medicine your surgeon prescribes. Follow the directions carefully. Do not take over-the-counter pain medicine unless your surgeon says it is okay. Some pain medicine can cause bleeding problems for several weeks after surgery. °· Follow your surgeon's instructions about driving. You will probably not be permitted to drive after heart surgery. °· Take any medicines your surgeon prescribes. Any medicines you took before your heart surgery should be checked with your health care provider before you start taking them again. °Wound care °· If your surgeon has prescribed an elastic bandage or stocking, ask how long you should wear it. °· Check the area around your surgical  cuts (incisions) whenever your bandages (dressings) are changed. Look for any redness or swelling. °· You will need to return to have the stitches (sutures) or staples taken out. Ask your surgeon when to do that. °· Ask your surgeon when you can shower or bathe. °Activity °· Try to keep your legs raised when you are sitting. °· Do any exercises your health care providers have given you. These may include deep breathing exercises, coughing, walking, or other exercises. °SEEK MEDICAL CARE IF: °· You have any questions about your medicines. °· You have more leg pain, especially if your pain medicine stops working. °· New or growing bruises develop on your leg. °· Your leg swells, feels tight, or becomes red. °· You have numbness in your leg. °SEEK IMMEDIATE MEDICAL CARE IF: °· Your pain gets much worse. °· Blood or fluid leaks from any of the incisions. °· Your incisions become warm, swollen, or red. °· You have chest pain. °· You have trouble breathing. °· You have a fever. °· You have more pain near your leg incision. °MAKE SURE YOU: °· Understand these instructions. °· Will watch your condition. °· Will get help right away if you are not doing well or   get worse.   This information is not intended to replace advice gEndoscopic Saphenous Vein Harvesting, Care After Refer to this sheet in the next few weeks. These instructions provide you with information on caring for yourself after your procedure. Your health care provider may also give you more specific instructions. Your treatment has been planned according to current medical practices, but problems sometimes occur. Call your health care provider if you have any problems or questions after your procedure. HOME CARE INSTRUCTIONS Medicine  Take whatever pain medicine your surgeon prescribes. Follow the directions carefully. Do not take over-the-counter pain medicine unless your surgeon says it is okay. Some pain medicine can cause bleeding problems for several  weeks after surgery.  Follow your surgeon's instructions about driving. You will probably not be permitted to drive after heart surgery.  Take any medicines your surgeon prescribes. Any medicines you took before your heart surgery should be checked with your health care provider before you start taking them again. Wound care  If your surgeon has prescribed an elastic bandage or stocking, ask how long you should wear it.  Check the area around your surgical cuts (incisions) whenever your bandages (dressings) are changed. Look for any redness or swelling.  You will need to return to have the stitches (sutures) or staples taken out. Ask your surgeon when to do that.  Ask your surgeon when you can shower or bathe. Activity  Try to keep your legs raised when you are sitting.  Do any exercises your health care providers have given you. These may include deep breathing exercises, coughing, walking, or other exercises. SEEK MEDICAL CARE IF:  You have any questions about your medicines.  You have more leg pain, especially if your pain medicine stops working.  New or growing bruises develop on your leg.  Your leg swells, feels tight, or becomes red.  You have numbness in your leg. SEEK IMMEDIATE MEDICAL CARE IF:  Your pain gets much worse.  Blood or fluid leaks from any of the incisions.  Your incisions become warm, swollen, or red.  You have chest pain.  You have trouble breathing.  You have a fever.  You have more pain near your leg incision. MAKE SURE YOU:  Understand these instructions.  Will watch your condition.  Will get help right away if you are not doing well or get worse.   This information is not intended to replace advice given to you by your health care provider. Make sure you discuss any questions you have with your health care provider.   Document Released: 06/26/2011 Document Revised: 11/04/2014 Document Reviewed: 06/26/2011 Elsevier Interactive Patient  Education 2016 [employer]. iven to you by your health care provider. Make sure you discuss any questions you have with your health care provider.   Document Released: 06/26/2011 Document Revised: 11/04/2014 Document Reviewed: 06/26/2011 Elsevier Interactive Patient Education [employer]

## 2016-01-09 ENCOUNTER — Other Ambulatory Visit: Payer: Self-pay | Admitting: Cardiothoracic Surgery

## 2016-01-09 DIAGNOSIS — Z951 Presence of aortocoronary bypass graft: Secondary | ICD-10-CM

## 2016-01-10 ENCOUNTER — Ambulatory Visit: Payer: Self-pay | Admitting: Cardiothoracic Surgery

## 2016-01-11 ENCOUNTER — Telehealth (HOSPITAL_COMMUNITY): Payer: Self-pay | Admitting: *Deleted

## 2016-01-11 NOTE — Telephone Encounter (Signed)
Message left for pt to please contact cardiac rehab to sign up for exercise s/p CABG x 3.  Contact information provided. Alanson Alyarlette Carlton RN, BSN

## 2016-01-16 ENCOUNTER — Ambulatory Visit: Payer: Self-pay | Admitting: Cardiothoracic Surgery

## 2016-01-17 ENCOUNTER — Ambulatory Visit
Admission: RE | Admit: 2016-01-17 | Discharge: 2016-01-17 | Disposition: A | Discharge status: Home / Self Care | Payer: BC Managed Care – PPO | Source: Ambulatory Visit | Attending: Cardiothoracic Surgery | Admitting: Cardiothoracic Surgery

## 2016-01-17 ENCOUNTER — Encounter: Payer: Self-pay | Admitting: Cardiothoracic Surgery

## 2016-01-17 ENCOUNTER — Ambulatory Visit (INDEPENDENT_AMBULATORY_CARE_PROVIDER_SITE_OTHER): Payer: Self-pay | Admitting: Cardiothoracic Surgery

## 2016-01-17 VITALS — BP 130/84 | HR 82 | Resp 20 | Ht 72.0 in | Wt 217.0 lb

## 2016-01-17 DIAGNOSIS — Z951 Presence of aortocoronary bypass graft: Secondary | ICD-10-CM

## 2016-01-17 NOTE — Progress Notes (Signed)
PCP is No PCP Per Patient Referring Provider is [place]il[place], Kemberly Taves M, MD  Chief Complaint  Patient presents with  . 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 Left IMA to LAD and saphenous vein graft to OM1 and OM 2. Patient is been quite active without recurrent angina. Surgical incision is well-healed. Patient's cardiologist is Dr. Donnie Ahoilley  Past Medical History  Diagnosis Date  . Hyperlipidemia   . GERD without esophagitis     Past Surgical History  Procedure Laterality Date  . Knee arthroscopy Left   . Cardiac catheterization N/A 12/15/2015    Procedure: Left Heart Cath and Coronary Angiography;  Surgeon: Ginger Leeth M [place], MD;  [facility];  Service: Cardiovascular;  Laterality: N/A;  . Coronary artery bypass graft  12/16/15    LIMA to LAD, SVG to OM, SVg, to OM2  . Coronary artery bypass graft N/A 12/16/2015    Procedure: CORONARY ARTERY BYPASS GRAFTING: LIMA to LAD, SVG to OM1 and SVG to OM2.  ENDOSCOPIC HARVEST OF GREATER SAPHENOUS VEIN (RIGHT THIGH);  Surgeon: Kerin PernaPeter Van Trigt, MD;  [facility];  Service: Open Heart Surgery;  Laterality: N/A;  . Intraoperative transesophageal echocardiogram N/A 12/16/2015    Procedure: INTRAOPERATIVE TRANSESOPHAGEAL ECHOCARDIOGRAM;  Surgeon: Kerin PernaPeter Van Trigt, MD;  [facility];  Service: Open Heart Surgery;  Laterality: N/A;    No family history on file.  Social History Social History  Substance Use Topics  . Smoking status: Never Smoker   . Smokeless tobacco: Never Used  . Alcohol Use: 0.0 oz/week    0 Standard drinks or equivalent per week     Comment: socially     Current Outpatient Prescriptions  Medication Sig Dispense Refill  . aspirin EC 325 MG EC tablet Take 1 tablet (325 mg total) by mouth daily.    Marland Kitchen. atorvastatin (LIPITOR) 80 MG tablet Take 1 tablet (80 mg total) by  mouth daily at 6 PM. 30 tablet 1  . lisinopril (PRINIVIL,ZESTRIL) 5 MG tablet Take 1 tablet (5 mg total) by mouth daily. 30 tablet 1  . metoprolol tartrate (LOPRESSOR) 25 MG tablet Take 0.5 tablets (12.5 mg total) by mouth 2 (two) times daily. 31 tablet 1  . omeprazole (PRILOSEC) 20 MG capsule Take 20 mg by mouth daily as needed.     No current facility-administered medications for this visit.    No Known Allergies  Review of Systems  Improved appetite and strength No incisional pain or pain medicine requirement No symptoms of CHF or edema  BP 130/84 mmHg  Pulse 82  Resp 20  Ht 6' (1.829 m)  Wt 217 lb (98.431 kg)  BMI 29.42 kg/m2  SpO2 97% Physical Exam Alert and comfortable Heart rate regular without murmur or gallop Lungs clear Abdomen soft Surgical incisions all well-healed  Diagnostic Tests: Chest x-ray today personally reviewed showed clear lung fields, sternal wires intact, no pleural effusion  Impression: Excellent early recovery after urgent CABG x3 Patient may drive in may do normal activities but no lifting more than 20 pounds until 3 months after surgery.  Plan:continue current medications Start cardiac rehabilitation after patient is evaluated by Dr. Donnie Ahoilley Return as needed   Mikey BussingPeter Van Trigt III, MD [facility] [phone]

## 2016-01-22 ENCOUNTER — Ambulatory Visit: Payer: BC Managed Care – PPO

## 2016-01-30 ENCOUNTER — Encounter (HOSPITAL_COMMUNITY): Payer: Self-pay

## 2016-01-30 ENCOUNTER — Encounter (HOSPITAL_COMMUNITY)
Admission: RE | Admit: 2016-01-30 | Discharge: 2016-01-30 | Disposition: A | Discharge status: Home / Self Care | Payer: BC Managed Care – PPO | Source: Ambulatory Visit | Attending: Cardiology | Admitting: Cardiology

## 2016-01-30 VITALS — BP 122/60 | HR 98 | Ht 71.75 in | Wt 211.4 lb

## 2016-01-30 DIAGNOSIS — Z7982 Long term (current) use of aspirin: Secondary | ICD-10-CM | POA: Insufficient documentation

## 2016-01-30 DIAGNOSIS — K219 Gastro-esophageal reflux disease without esophagitis: Secondary | ICD-10-CM | POA: Insufficient documentation

## 2016-01-30 DIAGNOSIS — E785 Hyperlipidemia, unspecified: Secondary | ICD-10-CM | POA: Diagnosis not present

## 2016-01-30 DIAGNOSIS — Z951 Presence of aortocoronary bypass graft: Secondary | ICD-10-CM | POA: Diagnosis not present

## 2016-01-30 DIAGNOSIS — Z79899 Other long term (current) drug therapy: Secondary | ICD-10-CM | POA: Diagnosis not present

## 2016-01-30 NOTE — Progress Notes (Signed)
Cardiac Individual Treatment Plan  Patient Details  Name: Adam Taylor MRN: [id number] Date of Birth: 10/09/1966 Referring Provider:  Othella Boyer, MD  Initial Encounter Date:       CARDIAC REHAB PHASE II ORIENTATION from 01/30/2016 in [facility] CARDIAC REHAB   Date  01/30/16      Visit Diagnosis: S/P CABG x 3  Patient's Home Medications on Admission:  Current outpatient prescriptions:  .  aspirin EC 325 MG EC tablet, Take 1 tablet (325 mg total) by mouth daily., Disp: , Rfl:  .  atorvastatin (LIPITOR) 80 MG tablet, Take 1 tablet (80 mg total) by mouth daily at 6 PM., Disp: 30 tablet, Rfl: 1 .  lisinopril (PRINIVIL,ZESTRIL) 5 MG tablet, Take 1 tablet (5 mg total) by mouth daily., Disp: 30 tablet, Rfl: 1 .  metoprolol tartrate (LOPRESSOR) 25 MG tablet, Take 0.5 tablets (12.5 mg total) by mouth 2 (two) times daily., Disp: 31 tablet, Rfl: 1 .  omeprazole (PRILOSEC) 20 MG capsule, Take 20 mg by mouth daily as needed., Disp: , Rfl:   Past Medical History: Past Medical History  Diagnosis Date  . Hyperlipidemia   . GERD without esophagitis     Tobacco Use: History  Smoking status  . Never Smoker   Smokeless tobacco  . Never Used    Labs:     Recent Review Flowsheet Data    Labs for ITP Cardiac and Pulmonary Rehab Latest Ref Rng 12/16/2015 12/16/2015 12/16/2015 12/16/2015 12/17/2015   PHART 7.350 - 7.450 7.253(L) 7.402 7.283(L) - -   PCO2ART 35.0 - 45.0 mmHg 54.6(H) 31.1(L) 46.2(H) - -   HCO3 20.0 - 24.0 mEq/L 24.5(H) 19.6(L) 22.2 - -   TCO2 0 - 100 mmol/L 26 21 24 24 24    ACIDBASEDEF 0.0 - 2.0 mmol/L 3.0(H) 5.0(H) 5.0(H) - -   O2SAT - 98.0 99.0 90.0 - -      Capillary Blood Glucose: Lab Results  Component Value Date   GLUCAP 91 12/19/2015   GLUCAP 90 12/18/2015   GLUCAP 93 12/18/2015   GLUCAP 81 12/17/2015   GLUCAP 147* 12/17/2015     Exercise Target Goals: Date: 01/30/16  Exercise Program Goal: Individual exercise prescription set  with THRR, safety & activity barriers. Participant demonstrates ability to understand and report RPE using BORG scale, to self-measure pulse accurately, and to acknowledge the importance of the exercise prescription.  Exercise Prescription Goal: Starting with aerobic activity 30 plus minutes a day, 3 days per week for initial exercise prescription. Provide home exercise prescription and guidelines that participant acknowledges understanding prior to discharge.  Activity Barriers & Risk Stratification:     Activity Barriers & Cardiac Risk Stratification - 01/30/16 1413    Activity Barriers & Cardiac Risk Stratification   Activity Barriers None   Cardiac Risk Stratification High      6 Minute Walk:     6 Minute Walk      01/30/16 1539       6 Minute Walk   Phase Initial     Distance 2300 feet     Walk Time 6 minutes     # of Rest Breaks 0     MPH 4.36     METS 5.97     RPE 9     VO2 Peak 20.87     Symptoms No     Resting HR 98 bpm     Resting BP 122/60 mmHg     Max Ex. HR 134 bpm  Max Ex. BP 128/78 mmHg     2 Minute Post BP 114/74 mmHg        Initial Exercise Prescription:     Initial Exercise Prescription - 01/30/16 1500    Date of Initial Exercise Prescription   Date 01/30/16   Treadmill   MPH 3.5   Grade 3   Minutes 10   METs 5.13   Bike   Level 2.5   Minutes 10   METs 5.9   NuStep   Level 4   Minutes 10   METs 3   Prescription Details   Frequency (times per week) 3   Duration Progress to 30 minutes of continuous aerobic without signs/symptoms of physical distress   Intensity   THRR 40-80% of Max Heartrate 68-135   Ratings of Perceived Exertion 11-13   Progression   Progression Continue to progress workloads to maintain intensity without signs/symptoms of physical distress.   Resistance Training   Training Prescription Yes   Weight 3 lbs   Reps 10-12      Perform Capillary Blood Glucose checks as needed.  Exercise Prescription  Changes:   Exercise Comments:   Discharge Exercise Prescription (Final Exercise Prescription Changes):   Nutrition:  Target Goals: Understanding of nutrition guidelines, daily intake of sodium 1500mg , cholesterol 200mg , calories 30% from fat and 7% or less from saturated fats, daily to have 5 or more servings of fruits and vegetables.  Biometrics:     Pre Biometrics - 01/30/16 1334    Pre Biometrics   Waist Circumference 37 inches   Hip Circumference 38 inches   Waist to Hip Ratio 0.97 %   Triceps Skinfold 15 mm   % Body Fat 25.6 %   Grip Strength 15 kg   Flexibility 16 in   Single Leg Stand 30 seconds       Nutrition Therapy Plan and Nutrition Goals:   Nutrition Discharge: Nutrition Scores:   Nutrition Goals Re-Evaluation:   Psychosocial: Target Goals: Acknowledge presence or absence of depression, maximize coping skills, provide positive support system. Participant is able to verbalize types and ability to use techniques and skills needed for reducing stress and depression.  Initial Review & Psychosocial Screening:   Quality of Life Scores:     Quality of Life - 01/30/16 1545    Quality of Life Scores   Health/Function Pre 25.4 %   Socioeconomic Pre 22.71 %   Psych/Spiritual Pre 25.5 %   Family Pre 20 %   GLOBAL Pre 24.07 %      PHQ-9:     Recent Review Flowsheet Data    There is no flowsheet data to display.      Psychosocial Evaluation and Intervention:   Psychosocial Re-Evaluation:   Vocational Rehabilitation: Provide vocational rehab assistance to qualifying candidates.   Vocational Rehab Evaluation & Intervention:     Vocational Rehab - 01/30/16 1550    Initial Vocational Rehab Evaluation & Intervention   Assessment shows need for Vocational Rehabilitation Yes   Vocational Rehab Packet given to patient 01/30/16  Mr Scharnhorst was given a vocational rehab packet to take home and fill out.      Education: Education Goals:  Education classes will be provided on a weekly basis, covering required topics. Participant will state understanding/return demonstration of topics presented.  Learning Barriers/Preferences:     Learning Barriers/Preferences - 01/30/16 1544    Learning Barriers/Preferences   Learning Barriers Sight  glasses   Learning Preferences Written Material  Education Topics: Count Your Pulse:  -Group instruction provided by verbal instruction, demonstration, patient participation and written materials to support subject.  Instructors address importance of being able to find your pulse and how to count your pulse when at home without a heart monitor.  Patients get hands on experience counting their pulse with staff help and individually.   Heart Attack, Angina, and Risk Factor Modification:  -Group instruction provided by verbal instruction, video, and written materials to support subject.  Instructors address signs and symptoms of angina and heart attacks.    Also discuss risk factors for heart disease and how to make changes to improve heart health risk factors.   Functional Fitness:  -Group instruction provided by verbal instruction, demonstration, patient participation, and written materials to support subject.  Instructors address safety measures for doing things around the house.  Discuss how to get up and down off the floor, how to pick things up properly, how to safely get out of a chair without assistance, and balance training.   Meditation and Mindfulness:  -Group instruction provided by verbal instruction, patient participation, and written materials to support subject.  Instructor addresses importance of mindfulness and meditation practice to help reduce stress and improve awareness.  Instructor also leads participants through a meditation exercise.    Stretching for Flexibility and Mobility:  -Group instruction provided by verbal instruction, patient participation, and written  materials to support subject.  Instructors lead participants through series of stretches that are designed to increase flexibility thus improving mobility.  These stretches are additional exercise for major muscle groups that are typically performed during regular warm up and cool down.   Hands Only CPR Anytime:  -Group instruction provided by verbal instruction, video, patient participation and written materials to support subject.  Instructors co-teach with AHA video for hands only CPR.  Participants get hands on experience with mannequins.   Nutrition I class: Heart Healthy Eating:  -Group instruction provided by PowerPoint slides, verbal discussion, and written materials to support subject matter. The instructor gives an explanation and review of the Therapeutic Lifestyle Changes diet recommendations, which includes a discussion on lipid goals, dietary fat, sodium, fiber, plant stanol/sterol esters, sugar, and the components of a well-balanced, healthy diet.   Nutrition II class: Lifestyle Skills:  -Group instruction provided by PowerPoint slides, verbal discussion, and written materials to support subject matter. The instructor gives an explanation and review of label reading, grocery shopping for heart health, heart healthy recipe modifications, and ways to make healthier choices when eating out.   Diabetes Question & Answer:  -Group instruction provided by PowerPoint slides, verbal discussion, and written materials to support subject matter. The instructor gives an explanation and review of diabetes co-morbidities, pre- and post-prandial blood glucose goals, pre-exercise blood glucose goals, signs, symptoms, and treatment of hypoglycemia and hyperglycemia, and foot care basics.   Diabetes Blitz:  -Group instruction provided by PowerPoint slides, verbal discussion, and written materials to support subject matter. The instructor gives an explanation and review of the physiology behind type 1  and type 2 diabetes, diabetes medications and rational behind using different medications, pre- and post-prandial blood glucose recommendations and Hemoglobin A1c goals, diabetes diet, and exercise including blood glucose guidelines for exercising safely.    Portion Distortion:  -Group instruction provided by PowerPoint slides, verbal discussion, written materials, and food models to support subject matter. The instructor gives an explanation of serving size versus portion size, changes in portions sizes over the last 20 years, and what  consists of a serving from each food group.   Stress Management:  -Group instruction provided by verbal instruction, video, and written materials to support subject matter.  Instructors review role of stress in heart disease and how to cope with stress positively.     Exercising on Your Own:  -Group instruction provided by verbal instruction, power point, and written materials to support subject.  Instructors discuss benefits of exercise, components of exercise, frequency and intensity of exercise, and end points for exercise.  Also discuss use of nitroglycerin and activating EMS.  Review options of places to exercise outside of rehab.  Review guidelines for sex with heart disease.   Cardiac Drugs I:  -Group instruction provided by verbal instruction and written materials to support subject.  Instructor reviews cardiac drug classes: antiplatelets, anticoagulants, beta blockers, and statins.  Instructor discusses reasons, side effects, and lifestyle considerations for each drug class.   Cardiac Drugs II:  -Group instruction provided by verbal instruction and written materials to support subject.  Instructor reviews cardiac drug classes: angiotensin converting enzyme inhibitors (ACE-I), angiotensin II receptor blockers (ARBs), nitrates, and calcium channel blockers.  Instructor discusses reasons, side effects, and lifestyle considerations for each drug  class.   Anatomy and Physiology of the Circulatory System:  -Group instruction provided by verbal instruction, video, and written materials to support subject.  Reviews functional anatomy of heart, how it relates to various diagnoses, and what role the heart plays in the overall system.   Knowledge Questionnaire Score:     Knowledge Questionnaire Score - 01/30/16 1544    Knowledge Questionnaire Score   Pre Score 23/24      Core Components/Risk Factors/Patient Goals at Admission:     Personal Goals and Risk Factors at Admission - 01/30/16 1415    Core Components/Risk Factors/Patient Goals on Admission    Weight Management Yes;Weight Loss   Intervention Weight Management: Develop a combined nutrition and exercise program designed to reach desired caloric intake, while maintaining appropriate intake of nutrient and fiber, sodium and fats, and appropriate energy expenditure required for the weight goal.;Weight Management: Provide education and appropriate resources to help participant work on and attain dietary goals.   Admit Weight 211 lb 6.7 oz (95.9 kg)   Goal Weight: Short Term 207 lb (93.895 kg)   Goal Weight: Long Term 199 lb (90.266 kg)   Expected Outcomes Weight Maintenance: Understanding of the daily nutrition guidelines, which includes 25-35% calories from fat, 7% or less cal from saturated fats, less than  cholesterol, less than 1.5gm of sodium, & 5 or more servings of fruits and vegetables daily;Short Term: Continue to assess and modify interventions until short term weight is achieved;Long Term: Adherence to nutrition and physical activity/exercise program aimed toward attainment of established weight goal;Understanding recommendations for meals to include 15-35% energy as protein, 25-35% energy from fat, 35-60% energy from carbohydrates, less than  of dietary cholesterol, 20-35 gm of total fiber daily;Understanding of distribution of calorie intake throughout the day  with the consumption of 4-5 meals/snacks;Weight Loss: Understanding of general recommendations for a balanced deficit meal plan, which promotes 1-2 lb weight loss per week and includes a negative energy balance of [phone] kcal/d   Sedentary Yes   Intervention Provide advice, education, support and counseling about physical activity/exercise needs.;Develop an individualized exercise prescription for aerobic and resistive training based on initial evaluation findings, risk stratification, comorbidities and participant's personal goals.   Expected Outcomes Achievement of increased cardiorespiratory fitness and enhanced flexibility, muscular endurance and  strength shown through measurements of functional capacity and personal statement of participant.   Lipids Yes   Intervention Provide education and support for participant on nutrition & aerobic/resistive exercise along with prescribed medications to achieve LDL 70mg , HDL >40mg .   Expected Outcomes Short Term: Participant states understanding of desired cholesterol values and is compliant with medications prescribed. Participant is following exercise prescription and nutrition guidelines.;Long Term: Cholesterol controlled with medications as prescribed, with individualized exercise RX and with personalized nutrition plan. Value goals: LDL < 70mg , HDL > 40 mg.   Stress Yes   Intervention Offer individual and/or small group education and counseling on adjustment to heart disease, stress management and health-related lifestyle change. Teach and support self-help strategies.;Refer participants experiencing significant psychosocial distress to appropriate mental health specialists for further evaluation and treatment. When possible, include family members and significant others in education/counseling sessions.   Expected Outcomes Short Term: Participant demonstrates changes in health-related behavior, relaxation and other stress management skills, ability to  obtain effective social support, and compliance with psychotropic medications if prescribed.;Long Term: Emotional wellbeing is indicated by absence of clinically significant psychosocial distress or social isolation.   Personal Goal Other Yes   Personal Goal Healthier lifestyle   Intervention Provide education in exericse and nutrition to improve lifestyle   Expected Outcomes Able to maintain a healthier life long term      Core Components/Risk Factors/Patient Goals Review:    Core Components/Risk Factors/Patient Goals at Discharge (Final Review):    ITP Comments:     ITP Comments      01/30/16 1412           ITP Comments Medical Director-Dr. Armanda Magic, MD          Comments: Patient attended orientation from 1330 to 1515 to review rules and guidelines for program. Completed 6 minute walk test, Intitial ITP, and exercise prescription.  VSS. Telemetry-Sinus rhythm with a rare PVC noted.  Asymptomatic.

## 2016-01-30 NOTE — Progress Notes (Signed)
Cardiac Rehab Medication Review by a Pharmacist  Does the patient  feel that his/her medications are working for him/her?  yes  Has the patient been experiencing any side effects to the medications prescribed?  no  Does the patient measure his/her own blood pressure or blood glucose at home?  no   Does the patient have any problems obtaining medications due to transportation or finances?   no  Understanding of regimen: good Understanding of indications: good Potential of compliance: excellent    Reuel Derbyobert J Vincent, PharmD 01/30/2016 1:50 PM

## 2016-01-31 ENCOUNTER — Ambulatory Visit: Payer: Self-pay | Admitting: Cardiothoracic Surgery

## 2016-02-05 ENCOUNTER — Encounter (HOSPITAL_COMMUNITY)
Admission: RE | Admit: 2016-02-05 | Discharge: 2016-02-05 | Disposition: A | Discharge status: Home / Self Care | Payer: BC Managed Care – PPO | Source: Ambulatory Visit | Attending: Cardiology | Admitting: Cardiology

## 2016-02-05 DIAGNOSIS — Z951 Presence of aortocoronary bypass graft: Secondary | ICD-10-CM | POA: Diagnosis not present

## 2016-02-05 NOTE — Progress Notes (Signed)
Daily Session Note  Patient Details  Name: Adam Taylor MRN: [id number] Date of Birth: 1965/11/09 Referring Provider:  Dr. Landry Corporal  Encounter Date: 02/05/2016  Check In:     Session Check In - 02/05/16 0717    Check-In   Location [facility]   Staff Present Maurice Small, RN, BSN;Amber Fair, MS, ACSM RCEP, Exercise Physiologist;Joann Rion, RN, BSN   Physician(s) Dr. Marily Memos   Medication changes reported     No   Fall or balance concerns reported    No   Warm-up and Cool-down Performed as group-led instruction   Resistance Training Performed Yes   VAD Patient? No   Pain Assessment   Currently in Pain? No/denies      Capillary Blood Glucose: No results found for this or any previous visit (from the past 24 hour(s)).   Goals Met:  Personal goals reviewed  Goals Unmet:  Not Applicable  Comments: Pt started full exercise today in the cardiac rehab phase II progaram.  Pt tolerated light exercise without difficulty but did have to decrease workloads due to exceeding target heart rate. Pt had stress test completed in the office last week. Message left for Dr. Wynonia Lawman nurse  to have the stress test faxed over for review.. VSS Heart rate remained elevated today, telemetry-SR with no note ectopy, asymptomatic.  Medication list reconciled. Pt stated that he takes metoprolol tartrate 12.mg once a day.  In epic, this is listed as twice a day.  Pt thinks that he may have been told to by [name] to cut this back to once a day.  Reviewed surgeons note in epic, there is not notation.  Dr. Wynonia Lawman office called and message left for his nurse.  In basket also sent to Dr. Wynonia Lawman.   Pt denies barriers to medicaiton compliance.  PSYCHOSOCIAL ASSESSMENT:  PHQ-0. Pt exhibits positive coping skills, hopeful outlook with supportive family. No psychosocial needs identified at this time, no psychosocial interventions necessary.    Pt enjoys outside activities that involve  physical activity such as hiking, boat ski, college football and family .   Pt oriented to exercise equipment and routine.    Understanding verbalized. Maurice Small RN, BSN     Dr. Fransico Him is Medical Director for Cardiac Rehab at [facility].

## 2016-02-07 ENCOUNTER — Encounter (HOSPITAL_COMMUNITY)
Admission: RE | Admit: 2016-02-07 | Discharge: 2016-02-07 | Disposition: A | Discharge status: Home / Self Care | Payer: BC Managed Care – PPO | Source: Ambulatory Visit | Attending: Cardiology | Admitting: Cardiology

## 2016-02-07 DIAGNOSIS — Z951 Presence of aortocoronary bypass graft: Secondary | ICD-10-CM

## 2016-02-07 NOTE — Progress Notes (Signed)
Daily Session Note  Patient Details  Name: Adam Taylor MRN: [id number] Date of Birth: 03-Mar-1966 Referring Provider:  Tollie Eth  Encounter Date: 02/07/2016  Check In:     Session Check In - 02/07/16 0706    Check-In   Location [facility]   Staff Present Maurice Small, RN, BSN;Amber Fair, MS, ACSM RCEP, Exercise Physiologist;Olinty Celesta Aver, MS, ACSM CEP, Exercise Physiologist   Supervising physician immediately available to respond to emergencies [facility] immediately available   Physician(s) Dr. Marily Memos   Medication changes reported     No   Fall or balance concerns reported    No   Warm-up and Cool-down Performed as group-led instruction   Resistance Training Performed No   VAD Patient? No   Pain Assessment   Currently in Pain? No/denies   Multiple Pain Sites No      Capillary Blood Glucose: No results found for this or any previous visit (from the past 24 hour(s)).   Goals Met:  No report of cardiac concerns or symptoms  Goals Unmet:  Not Applicable  Comments: QUALITY OF LIFE SCORE REVIEW  Pt completed Quality of Life survey as a participant in Cardiac Rehab. Scores 21.0 or below are considered low. Pt scored the following:     Quality of Life - 01/30/16 1545    Quality of Life Scores   Health/Function Pre 25.4 %   Socioeconomic Pre 22.71 %   Psych/Spiritual Pre 25.5 %   Family Pre 20 %   GLOBAL Pre 24.07 %    Pt with good support at home and denies any needs at this time.   Will continue to monitor and intervene as necessary.  Maurice Small RN, BSN     Dr. Fransico Him is Medical Director for Cardiac Rehab at [facility].

## 2016-02-09 ENCOUNTER — Encounter (HOSPITAL_COMMUNITY)
Admission: RE | Admit: 2016-02-09 | Discharge: 2016-02-09 | Disposition: A | Discharge status: Home / Self Care | Payer: BC Managed Care – PPO | Source: Ambulatory Visit | Attending: Cardiology | Admitting: Cardiology

## 2016-02-09 DIAGNOSIS — Z951 Presence of aortocoronary bypass graft: Secondary | ICD-10-CM

## 2016-02-12 ENCOUNTER — Encounter (HOSPITAL_COMMUNITY)
Admission: RE | Admit: 2016-02-12 | Discharge: 2016-02-12 | Disposition: A | Discharge status: Home / Self Care | Payer: BC Managed Care – PPO | Source: Ambulatory Visit | Attending: Cardiology | Admitting: Cardiology

## 2016-02-12 DIAGNOSIS — Z951 Presence of aortocoronary bypass graft: Secondary | ICD-10-CM | POA: Diagnosis not present

## 2016-02-14 ENCOUNTER — Encounter (HOSPITAL_COMMUNITY)
Admission: RE | Admit: 2016-02-14 | Discharge: 2016-02-14 | Disposition: A | Discharge status: Home / Self Care | Payer: BC Managed Care – PPO | Source: Ambulatory Visit | Attending: Cardiology | Admitting: Cardiology

## 2016-02-14 DIAGNOSIS — Z951 Presence of aortocoronary bypass graft: Secondary | ICD-10-CM | POA: Diagnosis not present

## 2016-02-16 ENCOUNTER — Encounter (HOSPITAL_COMMUNITY)
Admission: RE | Admit: 2016-02-16 | Discharge: 2016-02-16 | Disposition: A | Discharge status: Home / Self Care | Payer: BC Managed Care – PPO | Source: Ambulatory Visit | Attending: Cardiology | Admitting: Cardiology

## 2016-02-16 DIAGNOSIS — Z951 Presence of aortocoronary bypass graft: Secondary | ICD-10-CM

## 2016-02-19 ENCOUNTER — Encounter (HOSPITAL_COMMUNITY)
Admission: RE | Admit: 2016-02-19 | Discharge: 2016-02-19 | Disposition: A | Discharge status: Home / Self Care | Payer: BC Managed Care – PPO | Source: Ambulatory Visit | Attending: Cardiology | Admitting: Cardiology

## 2016-02-19 DIAGNOSIS — Z951 Presence of aortocoronary bypass graft: Secondary | ICD-10-CM | POA: Diagnosis not present

## 2016-02-19 NOTE — Progress Notes (Signed)
Cardiac Individual Treatment Plan  Patient Details  Name: Adam Taylor MRN: [id number] Date of Birth: [birth year] Referring Provider:        CARDIAC REHAB PHASE II EXERCISE from 02/07/2016 in [facility] CARDIAC REHAB   Referring Provider  Ellwood Handler MD      Initial Encounter Date:       CARDIAC REHAB PHASE II EXERCISE from 02/07/2016 in [facility] CARDIAC REHAB   Date  01/30/16   Referring Provider  Ellwood Handler MD      Visit Diagnosis: S/P CABG x 3  Patient's Home Medications on Admission:  Current outpatient prescriptions:  .  aspirin EC 325 MG EC tablet, Take 1 tablet (325 mg total) by mouth daily., Disp: , Rfl:  .  atorvastatin (LIPITOR) 80 MG tablet, Take 1 tablet (80 mg total) by mouth daily at 6 PM., Disp: 30 tablet, Rfl: 1 .  lisinopril (PRINIVIL,ZESTRIL) 5 MG tablet, Take 1 tablet (5 mg total) by mouth daily., Disp: 30 tablet, Rfl: 1 .  metoprolol tartrate (LOPRESSOR) 25 MG tablet, Take 0.5 tablets (12.5 mg total) by mouth 2 (two) times daily. (Patient not taking: Reported on 02/05/2016), Disp: 31 tablet, Rfl: 1 .  omeprazole (PRILOSEC) 20 MG capsule, Take 20 mg by mouth daily as needed., Disp: , Rfl:   Past Medical History: Past Medical History  Diagnosis Date  . Hyperlipidemia   . GERD without esophagitis     Tobacco Use: History  Smoking status  . Never Smoker   Smokeless tobacco  . Never Used    Labs:     Recent Review Flowsheet Data    Labs for ITP Cardiac and Pulmonary Rehab Latest Ref Rng 12/16/2015 12/16/2015 12/16/2015 12/16/2015 12/17/2015   PHART 7.350 - 7.450 7.253(L) 7.402 7.283(L) - -   PCO2ART 35.0 - 45.0 mmHg 54.6(H) 31.1(L) 46.2(H) - -   HCO3 20.0 - 24.0 mEq/L 24.5(H) 19.6(L) 22.2 - -   TCO2 0 - 100 mmol/L ACIDBASEDEF 0.0 - 2.0 mmol/L 3.0(H) 5.0(H) 5.0(H) - -   O2SAT - 98.0 99.0 90.0 - -      Capillary Blood Glucose: Lab Results  Component Value Date   GLUCAP 91 12/19/2015   GLUCAP 90 12/18/2015   GLUCAP 93 12/18/2015   GLUCAP 81 12/17/2015   GLUCAP 147* 12/17/2015     Exercise Target Goals:    Exercise Program Goal: Individual exercise prescription set with THRR, safety & activity barriers. Participant demonstrates ability to understand and report RPE using BORG scale, to self-measure pulse accurately, and to acknowledge the importance of the exercise prescription.  Exercise Prescription Goal: Starting with aerobic activity 30 plus minutes a day, 3 days per week for initial exercise prescription. Provide home exercise prescription and guidelines that participant acknowledges understanding prior to discharge.  Activity Barriers & Risk Stratification:     Activity Barriers & Cardiac Risk Stratification - 01/30/16 1413    Activity Barriers & Cardiac Risk Stratification   Activity Barriers None   Cardiac Risk Stratification High      6 Minute Walk:     6 Minute Walk      01/30/16 1539       6 Minute Walk   Phase Initial     Distance 2300 feet     Walk Time 6 minutes     # of Rest Breaks 0     MPH 4.36     METS 5.97  RPE 9     VO2 Peak 20.87     Symptoms No     Resting HR 98 bpm     Resting BP 122/60 mmHg     Max Ex. HR 134 bpm     Max Ex. BP 128/78 mmHg     2 Minute Post BP 114/74 mmHg        Initial Exercise Prescription:     Initial Exercise Prescription - 02/08/16 1200    Date of Initial Exercise RX and Referring Provider   Date 01/30/16   Referring Provider Ellwood Handler MD   Treadmill   MPH 3.5   Grade 3   Minutes 10   METs 5.13   Bike   Level 2.5   Minutes 10   METs 5.9   NuStep   Level 4   Minutes 10   METs 3   Prescription Details   Frequency (times per week) 3   Intensity   THRR 40-80% of Max Heartrate 68-135   Ratings of Perceived Exertion 11-13   Progression   Progression Continue to progress workloads to maintain intensity without signs/symptoms of physical distress.   Resistance Training    Training Prescription Yes   Weight 3 lbs   Reps 10-12      Perform Capillary Blood Glucose checks as needed.  Exercise Prescription Changes:      Exercise Prescription Changes      02/14/16 0700 02/22/16 0700         Exercise Review   Progression Yes Yes      Response to Exercise   Blood Pressure (Admit) 110/74 mmHg 136/84 mmHg      Blood Pressure (Exercise) 136/64 mmHg 128/78 mmHg      Blood Pressure (Exit) 126/60 mmHg 102/62 mmHg      Heart Rate (Admit) 74 bpm 72 bpm      Heart Rate (Exercise) 116 bpm 141 bpm      Heart Rate (Exit) 67 bpm 80 bpm      Progression   Progression Continue to progress workloads to maintain intensity without signs/symptoms of physical distress. Continue to progress workloads to maintain intensity without signs/symptoms of physical distress.      Average METs 4.1 4.6      Resistance Training   Training Prescription Yes Yes      Weight 4lba 5lbs      Reps 10-12 10-12      Treadmill   MPH 3.3  decr. speed due to incr. HR at check in on first day 3.6      Grade 3 5      Minutes 10 10      METs 4.89  dec. METs due to a change in speed on TM 6.24      Bike   Level 2  decr. WL due to incr HR at check in on first day 2.5      Minutes 10 10      METs 4.55  dec. METs due to a change in speed/level on bike 5.84      NuStep   Level 4 6      Minutes 10 10      METs 3.3 4.6         Exercise Comments:      Exercise Comments      02/14/16 1201 02/22/16 0733         Exercise Comments Reviewed METs and goals, will continue to monitor exercise progression pt is tolerating exercise well,  Will continue to monitor exercise progresssion         Discharge Exercise Prescription (Final Exercise Prescription Changes):     Exercise Prescription Changes - 02/22/16 0700    Exercise Review   Progression Yes   Response to Exercise   Blood Pressure (Admit) 136/84 mmHg   Blood Pressure (Exercise) 128/78 mmHg   Blood Pressure (Exit) 102/62 mmHg    Heart Rate (Admit) 72 bpm   Heart Rate (Exercise) 141 bpm   Heart Rate (Exit) 80 bpm   Progression   Progression Continue to progress workloads to maintain intensity without signs/symptoms of physical distress.   Average METs 4.6   Resistance Training   Training Prescription Yes   Weight 5lbs   Reps 10-12   Treadmill   MPH 3.6   Grade 5   Minutes 10   METs 6.24   Bike   Level 2.5   Minutes 10   METs 5.84   NuStep   Level 6   Minutes 10   METs 4.6      Nutrition:  Target Goals: Understanding of nutrition guidelines, daily intake of sodium 1500mg , cholesterol 200mg , calories 30% from fat and 7% or less from saturated fats, daily to have 5 or more servings of fruits and vegetables.  Biometrics:     Pre Biometrics - 01/30/16 1334    Pre Biometrics   Waist Circumference 37 inches   Hip Circumference 38 inches   Waist to Hip Ratio 0.97 %   Triceps Skinfold 15 mm   % Body Fat 25.6 %   Grip Strength 15 kg   Flexibility 16 in   Single Leg Stand 30 seconds       Nutrition Therapy Plan and Nutrition Goals:     Nutrition Therapy & Goals - 01/31/16 0820    Nutrition Therapy   Diet Therapeutic Lifestyle Changes   Personal Nutrition Goals   Personal Goal #1 Weight loss of 6-24 lb at graduation from Cardiac Rehab   Intervention Plan   Intervention Prescribe, educate and counsel regarding individualized specific dietary modifications aiming towards targeted core components such as weight, hypertension, lipid management, diabetes, heart failure and other comorbidities.   Expected Outcomes Short Term Goal: Understand basic principles of dietary content, such as calories, fat, sodium, cholesterol and nutrients.;Long Term Goal: Adherence to prescribed nutrition plan.      Nutrition Discharge: Nutrition Scores:     Nutrition Assessments - 01/31/16 0819    MEDFICTS Scores   Pre Score 96      Nutrition Goals Re-Evaluation:   Psychosocial: Target Goals: Acknowledge  presence or absence of depression, maximize coping skills, provide positive support system. Participant is able to verbalize types and ability to use techniques and skills needed for reducing stress and depression.  Initial Review & Psychosocial Screening:     Initial Psych Review & Screening - 02/07/16 1609    Family Dynamics   Good Support System? Yes   Barriers   Psychosocial barriers to participate in program There are no identifiable barriers or psychosocial needs.      Quality of Life Scores:     Quality of Life - 01/30/16 1545    Quality of Life Scores   Health/Function Pre 25.4 %   Socioeconomic Pre 22.71 %   Psych/Spiritual Pre 25.5 %   Family Pre 20 %   GLOBAL Pre 24.07 %      PHQ-9:     Recent Review Flowsheet Data    Depression screen [facility] 2/9  02/05/2016   Decreased Interest 0   Down, Depressed, Hopeless 0   PHQ - 2 Score 0      Psychosocial Evaluation and Intervention:   Psychosocial Re-Evaluation:   Vocational Rehabilitation: Provide vocational rehab assistance to qualifying candidates.   Vocational Rehab Evaluation & Intervention:     Vocational Rehab - 02/05/16 0754    Initial Vocational Rehab Evaluation & Intervention   Vocational Rehab Packet given to patient --  Pt completed vocational rehab form. Will fax to [phone] today (02/05/16)      Education: Education Goals: Education classes will be provided on a weekly basis, covering required topics. Participant will state understanding/return demonstration of topics presented.  Learning Barriers/Preferences:     Learning Barriers/Preferences - 01/30/16 1544    Learning Barriers/Preferences   Learning Barriers Sight  glasses   Learning Preferences Written Material      Education Topics: Count Your Pulse:  -Group instruction provided by verbal instruction, demonstration, patient participation and written materials to support subject.  Instructors address importance of being able to  find your pulse and how to count your pulse when at home without a heart monitor.  Patients get hands on experience counting their pulse with staff help and individually.   Heart Attack, Angina, and Risk Factor Modification:  -Group instruction provided by verbal instruction, video, and written materials to support subject.  Instructors address signs and symptoms of angina and heart attacks.    Also discuss risk factors for heart disease and how to make changes to improve heart health risk factors.   Functional Fitness:  -Group instruction provided by verbal instruction, demonstration, patient participation, and written materials to support subject.  Instructors address safety measures for doing things around the house.  Discuss how to get up and down off the floor, how to pick things up properly, how to safely get out of a chair without assistance, and balance training.   Meditation and Mindfulness:  -Group instruction provided by verbal instruction, patient participation, and written materials to support subject.  Instructor addresses importance of mindfulness and meditation practice to help reduce stress and improve awareness.  Instructor also leads participants through a meditation exercise.    Stretching for Flexibility and Mobility:  -Group instruction provided by verbal instruction, patient participation, and written materials to support subject.  Instructors lead participants through series of stretches that are designed to increase flexibility thus improving mobility.  These stretches are additional exercise for major muscle groups that are typically performed during regular warm up and cool down.   Hands Only CPR Anytime:  -Group instruction provided by verbal instruction, video, patient participation and written materials to support subject.  Instructors co-teach with AHA video for hands only CPR.  Participants get hands on experience with mannequins.   Nutrition I class: Heart  Healthy Eating:  -Group instruction provided by PowerPoint slides, verbal discussion, and written materials to support subject matter. The instructor gives an explanation and review of the Therapeutic Lifestyle Changes diet recommendations, which includes a discussion on lipid goals, dietary fat, sodium, fiber, plant stanol/sterol esters, sugar, and the components of a well-balanced, healthy diet.   Nutrition II class: Lifestyle Skills:  -Group instruction provided by PowerPoint slides, verbal discussion, and written materials to support subject matter. The instructor gives an explanation and review of label reading, grocery shopping for heart health, heart healthy recipe modifications, and ways to make healthier choices when eating out.   Diabetes Question & Answer:  -Group instruction provided by PowerPoint slides,  verbal discussion, and written materials to support subject matter. The instructor gives an explanation and review of diabetes co-morbidities, pre- and post-prandial blood glucose goals, pre-exercise blood glucose goals, signs, symptoms, and treatment of hypoglycemia and hyperglycemia, and foot care basics.   Diabetes Blitz:  -Group instruction provided by PowerPoint slides, verbal discussion, and written materials to support subject matter. The instructor gives an explanation and review of the physiology behind type 1 and type 2 diabetes, diabetes medications and rational behind using different medications, pre- and post-prandial blood glucose recommendations and Hemoglobin A1c goals, diabetes diet, and exercise including blood glucose guidelines for exercising safely.    Portion Distortion:  -Group instruction provided by PowerPoint slides, verbal discussion, written materials, and food models to support subject matter. The instructor gives an explanation of serving size versus portion size, changes in portions sizes over the last 20 years, and what consists of a serving from each  food group.   Stress Management:  -Group instruction provided by verbal instruction, video, and written materials to support subject matter.  Instructors review role of stress in heart disease and how to cope with stress positively.     Exercising on Your Own:  -Group instruction provided by verbal instruction, power point, and written materials to support subject.  Instructors discuss benefits of exercise, components of exercise, frequency and intensity of exercise, and end points for exercise.  Also discuss use of nitroglycerin and activating EMS.  Review options of places to exercise outside of rehab.  Review guidelines for sex with heart disease.   Cardiac Drugs I:  -Group instruction provided by verbal instruction and written materials to support subject.  Instructor reviews cardiac drug classes: antiplatelets, anticoagulants, beta blockers, and statins.  Instructor discusses reasons, side effects, and lifestyle considerations for each drug class.   Cardiac Drugs II:  -Group instruction provided by verbal instruction and written materials to support subject.  Instructor reviews cardiac drug classes: angiotensin converting enzyme inhibitors (ACE-I), angiotensin II receptor blockers (ARBs), nitrates, and calcium channel blockers.  Instructor discusses reasons, side effects, and lifestyle considerations for each drug class.   Anatomy and Physiology of the Circulatory System:  -Group instruction provided by verbal instruction, video, and written materials to support subject.  Reviews functional anatomy of heart, how it relates to various diagnoses, and what role the heart plays in the overall system.   Knowledge Questionnaire Score:     Knowledge Questionnaire Score - 01/30/16 1544    Knowledge Questionnaire Score   Pre Score 23/24      Core Components/Risk Factors/Patient Goals at Admission:     Personal Goals and Risk Factors at Admission - 01/30/16 1415    Core Components/Risk  Factors/Patient Goals on Admission    Weight Management Yes;Weight Loss   Intervention Weight Management: Develop a combined nutrition and exercise program designed to reach desired caloric intake, while maintaining appropriate intake of nutrient and fiber, sodium and fats, and appropriate energy expenditure required for the weight goal.;Weight Management: Provide education and appropriate resources to help participant work on and attain dietary goals.   Admit Weight 211 lb 6.7 oz (95.9 kg)   Goal Weight: Short Term 207 lb (93.895 kg)   Goal Weight: Long Term 199 lb (90.266 kg)   Expected Outcomes Weight Maintenance: Understanding of the daily nutrition guidelines, which includes 25-35% calories from fat, 7% or less cal from saturated fats, less than 200mg  cholesterol, less than 1.5gm of sodium, & 5 or more servings of fruits and vegetables daily;Short  Term: Continue to assess and modify interventions until short term weight is achieved;Long Term: Adherence to nutrition and physical activity/exercise program aimed toward attainment of established weight goal;Understanding recommendations for meals to include 15-35% energy as protein, 25-35% energy from fat, 35-60% energy from carbohydrates, less than 200mg  of dietary cholesterol, 20-35 gm of total fiber daily;Understanding of distribution of calorie intake throughout the day with the consumption of 4-5 meals/snacks;Weight Loss: Understanding of general recommendations for a balanced deficit meal plan, which promotes 1-2 lb weight loss per week and includes a negative energy balance of [phone] kcal/d   Sedentary Yes   Intervention Provide advice, education, support and counseling about physical activity/exercise needs.;Develop an individualized exercise prescription for aerobic and resistive training based on initial evaluation findings, risk stratification, comorbidities and participant's personal goals.   Expected Outcomes Achievement of increased  cardiorespiratory fitness and enhanced flexibility, muscular endurance and strength shown through measurements of functional capacity and personal statement of participant.   Lipids Yes   Intervention Provide education and support for participant on nutrition & aerobic/resistive exercise along with prescribed medications to achieve LDL 70mg , HDL >40mg .   Expected Outcomes Short Term: Participant states understanding of desired cholesterol values and is compliant with medications prescribed. Participant is following exercise prescription and nutrition guidelines.;Long Term: Cholesterol controlled with medications as prescribed, with individualized exercise RX and with personalized nutrition plan. Value goals: LDL < 70mg , HDL > 40 mg.   Stress Yes   Intervention Offer individual and/or small group education and counseling on adjustment to heart disease, stress management and health-related lifestyle change. Teach and support self-help strategies.;Refer participants experiencing significant psychosocial distress to appropriate mental health specialists for further evaluation and treatment. When possible, include family members and significant others in education/counseling sessions.   Expected Outcomes Short Term: Participant demonstrates changes in health-related behavior, relaxation and other stress management skills, ability to obtain effective social support, and compliance with psychotropic medications if prescribed.;Long Term: Emotional wellbeing is indicated by absence of clinically significant psychosocial distress or social isolation.   Personal Goal Other Yes   Personal Goal Healthier lifestyle   Intervention Provide education in exericse and nutrition to improve lifestyle   Expected Outcomes Able to maintain a healthier life long term      Core Components/Risk Factors/Patient Goals Review:      Goals and Risk Factor Review      02/14/16 0751           Core Components/Risk  Factors/Patient Goals Review   Personal Goals Review Increase Strength and Stamina       Review increased energy       Expected Outcomes improved energy/stamina and exercise capacity          Core Components/Risk Factors/Patient Goals at Discharge (Final Review):      Goals and Risk Factor Review - 02/14/16 0751    Core Components/Risk Factors/Patient Goals Review   Personal Goals Review Increase Strength and Stamina   Review increased energy   Expected Outcomes improved energy/stamina and exercise capacity      ITP Comments:     ITP Comments      01/30/16 1412           ITP Comments Medical Director-Dr. Armanda Magic, MD          Comments:  Pt is making expected progress toward personal goals after completing 8 sessions. Pt has been in contact with vocational rehab and will continue to follow up with them.  Recommend continued exercise and  life style modification education including  stress management and relaxation techniques to decrease cardiac risk profile. Cherre Huger, BSN

## 2016-02-19 NOTE — Progress Notes (Signed)
Reviewed home exercise with pt today.  Pt plans to walk on treadmill or go to [employer] with [facility] for exercise.  Reviewed THR, pulse, RPE, sign and symptoms, NTG use, and when to call 911 or MD.  Also discussed weather considerations and indoor options.  Pt voiced understanding.   Roda Lauture [employer],MS,ACSM RCEP

## 2016-02-21 ENCOUNTER — Encounter (HOSPITAL_COMMUNITY)
Admission: RE | Admit: 2016-02-21 | Discharge: 2016-02-21 | Disposition: A | Discharge status: Home / Self Care | Payer: BC Managed Care – PPO | Source: Ambulatory Visit | Attending: Cardiology | Admitting: Cardiology

## 2016-02-21 DIAGNOSIS — Z951 Presence of aortocoronary bypass graft: Secondary | ICD-10-CM | POA: Diagnosis not present

## 2016-02-23 ENCOUNTER — Encounter (HOSPITAL_COMMUNITY)
Admission: RE | Admit: 2016-02-23 | Discharge: 2016-02-23 | Disposition: A | Discharge status: Home / Self Care | Payer: BC Managed Care – PPO | Source: Ambulatory Visit | Attending: Cardiology | Admitting: Cardiology

## 2016-02-23 DIAGNOSIS — Z951 Presence of aortocoronary bypass graft: Secondary | ICD-10-CM | POA: Diagnosis not present

## 2016-02-26 ENCOUNTER — Encounter (HOSPITAL_COMMUNITY)
Admission: RE | Admit: 2016-02-26 | Discharge: 2016-02-26 | Disposition: A | Discharge status: Home / Self Care | Payer: BC Managed Care – PPO | Source: Ambulatory Visit | Attending: Cardiology | Admitting: Cardiology

## 2016-02-26 DIAGNOSIS — Z951 Presence of aortocoronary bypass graft: Secondary | ICD-10-CM | POA: Diagnosis present

## 2016-02-26 DIAGNOSIS — Z7982 Long term (current) use of aspirin: Secondary | ICD-10-CM | POA: Insufficient documentation

## 2016-02-26 DIAGNOSIS — E785 Hyperlipidemia, unspecified: Secondary | ICD-10-CM | POA: Insufficient documentation

## 2016-02-26 DIAGNOSIS — K219 Gastro-esophageal reflux disease without esophagitis: Secondary | ICD-10-CM | POA: Insufficient documentation

## 2016-02-26 DIAGNOSIS — Z79899 Other long term (current) drug therapy: Secondary | ICD-10-CM | POA: Insufficient documentation

## 2016-02-28 ENCOUNTER — Encounter (HOSPITAL_COMMUNITY)
Admission: RE | Admit: 2016-02-28 | Discharge: 2016-02-28 | Disposition: A | Discharge status: Home / Self Care | Payer: BC Managed Care – PPO | Source: Ambulatory Visit | Attending: Cardiology | Admitting: Cardiology

## 2016-02-28 DIAGNOSIS — Z951 Presence of aortocoronary bypass graft: Secondary | ICD-10-CM | POA: Diagnosis not present

## 2016-03-01 ENCOUNTER — Encounter (HOSPITAL_COMMUNITY)
Admission: RE | Admit: 2016-03-01 | Discharge: 2016-03-01 | Disposition: A | Discharge status: Home / Self Care | Payer: BC Managed Care – PPO | Source: Ambulatory Visit | Attending: Cardiology | Admitting: Cardiology

## 2016-03-01 DIAGNOSIS — Z951 Presence of aortocoronary bypass graft: Secondary | ICD-10-CM | POA: Diagnosis not present

## 2016-03-04 ENCOUNTER — Other Ambulatory Visit: Payer: Self-pay | Admitting: Cardiothoracic Surgery

## 2016-03-04 ENCOUNTER — Encounter (HOSPITAL_COMMUNITY)
Admission: RE | Admit: 2016-03-04 | Discharge: 2016-03-04 | Disposition: A | Discharge status: Home / Self Care | Payer: BC Managed Care – PPO | Source: Ambulatory Visit | Attending: Cardiology | Admitting: Cardiology

## 2016-03-04 DIAGNOSIS — Z951 Presence of aortocoronary bypass graft: Secondary | ICD-10-CM

## 2016-03-06 ENCOUNTER — Encounter (HOSPITAL_COMMUNITY): Admission: RE | Admit: 2016-03-06 | Payer: BC Managed Care – PPO | Source: Ambulatory Visit

## 2016-03-08 ENCOUNTER — Encounter (HOSPITAL_COMMUNITY): Payer: BC Managed Care – PPO

## 2016-03-11 ENCOUNTER — Encounter (HOSPITAL_COMMUNITY)
Admission: RE | Admit: 2016-03-11 | Discharge: 2016-03-11 | Disposition: A | Discharge status: Home / Self Care | Payer: BC Managed Care – PPO | Source: Ambulatory Visit | Attending: Cardiology | Admitting: Cardiology

## 2016-03-11 DIAGNOSIS — Z951 Presence of aortocoronary bypass graft: Secondary | ICD-10-CM

## 2016-03-13 ENCOUNTER — Encounter (HOSPITAL_COMMUNITY)
Admission: RE | Admit: 2016-03-13 | Discharge: 2016-03-13 | Disposition: A | Discharge status: Home / Self Care | Payer: BC Managed Care – PPO | Source: Ambulatory Visit | Attending: Cardiology | Admitting: Cardiology

## 2016-03-13 DIAGNOSIS — Z951 Presence of aortocoronary bypass graft: Secondary | ICD-10-CM

## 2016-03-13 NOTE — Progress Notes (Signed)
Adam FloroStephen P Gauthier 50 y.o. male Nutrition Note Spoke with pt.  Nutrition Survey reviewed with pt. Pt is not following and does not plan on following the Therapeutic Lifestyle Changes diet. Pt is in the pre-contemplative/denial phase of change. Pt wants to lose wt. Pt states his highest wt was 255 lb about a year ago. Pt current wt is down 44 lb over the past year "because I stopped eating." Pt's mindset for wt loss discussed. Pt expressed understanding of the information reviewed. Pt aware of nutrition education classes offered. Lab Results  Component Value Date   HGBA1C 5.2 12/15/2015   Wt Readings from Last 3 Encounters:  01/30/16 211 lb 6.7 oz (95.9 kg)  01/17/16 217 lb (98.431 kg)  12/20/15 217 lb 14.4 oz (98.839 kg)   Nutrition Diagnosis ? Food-and nutrition-related knowledge deficit related to lack of exposure to information as related to diagnosis of: ? CVD ? Overweight related to excessive energy intake as evidenced by a BMI of 28.9 Nutrition Intervention ? Benefits of adopting Therapeutic Lifestyle Changes discussed when Medficts reviewed. ? Pt to attend the Portion Distortion class ? Pt to attend the  ? Nutrition I class                        ? Nutrition II class ? Pt given handouts for: ? Nutrition I class ? Nutrition II class ? Continue client-centered nutrition education by RD, as part of interdisciplinary care.  Goal(s) ? Pt to identify and limit food sources of saturated fat, trans fat, and sodium ? Pt to identify food quantities necessary to achieve weight loss of 6-24 lb (2.7-10.9 kg) at graduation from cardiac rehab.   Monitor and Evaluate progress toward nutrition goal with team.  Mickle PlumbEdna Lester Crickenberger, M.Ed, RD, LDN, CDE 03/13/2016 8:05 AM

## 2016-03-15 ENCOUNTER — Encounter (HOSPITAL_COMMUNITY)
Admission: RE | Admit: 2016-03-15 | Discharge: 2016-03-15 | Disposition: A | Discharge status: Home / Self Care | Payer: BC Managed Care – PPO | Source: Ambulatory Visit | Attending: Cardiology | Admitting: Cardiology

## 2016-03-15 DIAGNOSIS — Z951 Presence of aortocoronary bypass graft: Secondary | ICD-10-CM

## 2016-03-18 ENCOUNTER — Encounter (HOSPITAL_COMMUNITY)
Admission: RE | Admit: 2016-03-18 | Discharge: 2016-03-18 | Disposition: A | Discharge status: Home / Self Care | Payer: BC Managed Care – PPO | Source: Ambulatory Visit | Attending: Cardiology | Admitting: Cardiology

## 2016-03-18 DIAGNOSIS — Z951 Presence of aortocoronary bypass graft: Secondary | ICD-10-CM

## 2016-03-20 ENCOUNTER — Encounter (HOSPITAL_COMMUNITY): Payer: BC Managed Care – PPO

## 2016-03-20 NOTE — Progress Notes (Signed)
Cardiac Individual Treatment Plan  Patient Details  Name: Adam Taylor MRN: [id number] Date of Birth: 10-10-1966 Referring Provider:        CARDIAC REHAB PHASE II EXERCISE from 02/07/2016 in [facility] CARDIAC REHAB   Referring Provider  Ellwood Handler MD      Initial Encounter Date:       CARDIAC REHAB PHASE II EXERCISE from 02/07/2016 in [facility] CARDIAC REHAB   Date  01/30/16   Referring Provider  Ellwood Handler MD      Visit Diagnosis: S/P CABG x 3  Patient's Home Medications on Admission:  Current outpatient prescriptions:  .  aspirin EC 325 MG EC tablet, Take 1 tablet (325 mg total) by mouth daily., Disp: , Rfl:  .  atorvastatin (LIPITOR) 80 MG tablet, Take 1 tablet (80 mg total) by mouth daily at 6 PM., Disp: 30 tablet, Rfl: 1 .  lisinopril (PRINIVIL,ZESTRIL) 5 MG tablet, Take 1 tablet (5 mg total) by mouth daily., Disp: 30 tablet, Rfl: 1 .  metoprolol tartrate (LOPRESSOR) 25 MG tablet, Take 0.5 tablets (12.5 mg total) by mouth 2 (two) times daily. (Patient not taking: Reported on 02/05/2016), Disp: 31 tablet, Rfl: 1 .  omeprazole (PRILOSEC) 20 MG capsule, Take 20 mg by mouth daily as needed., Disp: , Rfl:   Past Medical History: Past Medical History  Diagnosis Date  . Hyperlipidemia   . GERD without esophagitis     Tobacco Use: History  Smoking status  . Never Smoker   Smokeless tobacco  . Never Used    Labs: Recent Review Flowsheet Data    Labs for ITP Cardiac and Pulmonary Rehab Latest Ref Rng 12/16/2015 12/16/2015 12/16/2015 12/16/2015 12/17/2015   PHART 7.350 - 7.450 7.253(L) 7.402 7.283(L) - -   PCO2ART 35.0 - 45.0 mmHg 54.6(H) 31.1(L) 46.2(H) - -   HCO3 20.0 - 24.0 mEq/L 24.5(H) 19.6(L) 22.2 - -   TCO2 0 - 100 mmol/L ACIDBASEDEF 0.0 - 2.0 mmol/L 3.0(H) 5.0(H) 5.0(H) - -   O2SAT - 98.0 99.0 90.0 - -      Capillary Blood Glucose: Lab Results  Component Value Date   GLUCAP 91 12/19/2015   GLUCAP 90 12/18/2015   GLUCAP 93 12/18/2015   GLUCAP 81 12/17/2015   GLUCAP 147* 12/17/2015     Exercise Target Goals:    Exercise Program Goal: Individual exercise prescription set with THRR, safety & activity barriers. Participant demonstrates ability to understand and report RPE using BORG scale, to self-measure pulse accurately, and to acknowledge the importance of the exercise prescription.  Exercise Prescription Goal: Starting with aerobic activity 30 plus minutes a day, 3 days per week for initial exercise prescription. Provide home exercise prescription and guidelines that participant acknowledges understanding prior to discharge.  Activity Barriers & Risk Stratification:     Activity Barriers & Cardiac Risk Stratification - 01/30/16 1413    Activity Barriers & Cardiac Risk Stratification   Activity Barriers None   Cardiac Risk Stratification High      6 Minute Walk:     6 Minute Walk      01/30/16 1539       6 Minute Walk   Phase Initial     Distance 2300 feet     Walk Time 6 minutes     # of Rest Breaks 0     MPH 4.36     METS 5.97     RPE 9  VO2 Peak 20.87     Symptoms No     Resting HR 98 bpm     Resting BP 122/60 mmHg     Max Ex. HR 134 bpm     Max Ex. BP 128/78 mmHg     2 Minute Post BP 114/74 mmHg        Initial Exercise Prescription:     Initial Exercise Prescription - 02/08/16 1200    Date of Initial Exercise RX and Referring Provider   Date 01/30/16   Referring Provider Ellwood Handler MD   Treadmill   MPH 3.5   Grade 3   Minutes 10   METs 5.13   Bike   Level 2.5   Minutes 10   METs 5.9   NuStep   Level 4   Minutes 10   METs 3   Prescription Details   Frequency (times per week) 3   Intensity   THRR 40-80% of Max Heartrate 68-135   Ratings of Perceived Exertion 11-13   Progression   Progression Continue to progress workloads to maintain intensity without signs/symptoms of physical distress.   Resistance Training    Training Prescription Yes   Weight 3 lbs   Reps 10-12      Perform Capillary Blood Glucose checks as needed.  Exercise Prescription Changes:     Exercise Prescription Changes      02/14/16 0700 02/22/16 0700 02/28/16 1700 03/13/16 1000     Exercise Review   Progression Yes Yes Yes Yes    Response to Exercise   Blood Pressure (Admit) 110/74 mmHg 136/84 mmHg 136/84 mmHg 142/88 mmHg    Blood Pressure (Exercise) 136/64 mmHg 128/78 mmHg 128/78 mmHg 158/70 mmHg    Blood Pressure (Exit) 126/60 mmHg 102/62 mmHg 104/70 mmHg 116/70 mmHg    Heart Rate (Admit) 74 bpm 72 bpm 72 bpm 93 bpm    Heart Rate (Exercise) 116 bpm 141 bpm 136 bpm 151 bpm    Heart Rate (Exit) 67 bpm 80 bpm 80 bpm 87 bpm    Progression   Progression Continue to progress workloads to maintain intensity without signs/symptoms of physical distress. Continue to progress workloads to maintain intensity without signs/symptoms of physical distress. Continue to progress workloads to maintain intensity without signs/symptoms of physical distress. Continue to progress workloads to maintain intensity without signs/symptoms of physical distress.    Average METs 4.1 4.6 5.4 6.1    Resistance Training   Training Prescription Yes Yes Yes No    Weight 4lba 5lbs 5LBS     Reps 10-12 10-12 10-12     Treadmill   MPH 3.3  decr. speed due to incr. HR at check in on first day 3.6 3.6 3.6    Grade 3 5 5 6     Minutes 10 10 10 10     METs 4.89  dec. METs due to a change in speed on TM 6.24 6.24 6.73    Bike   Level 2  decr. WL due to incr HR at check in on first day 2.5 2.5 2.5    Minutes 10 10 10 10     METs 4.55  dec. METs due to a change in speed/level on bike 5.84 5.84 5.84    NuStep   Level 4 6 6 6     Minutes 10 10 10 10     METs 3.3 4.6 4.6 5.4       Exercise Comments:     Exercise Comments      02/14/16 1201 02/22/16  4098 03/13/16 1014       Exercise Comments Reviewed METs and goals, will continue to monitor exercise  progression pt is tolerating exercise well, Will continue to monitor exercise progresssion Reviewed METs and goals with pt. pt is tolerating exercise in cardiac rehab well. Will continue to monitor exercise progression        Discharge Exercise Prescription (Final Exercise Prescription Changes):     Exercise Prescription Changes - 03/13/16 1000    Exercise Review   Progression Yes   Response to Exercise   Blood Pressure (Admit) 142/88 mmHg   Blood Pressure (Exercise) 158/70 mmHg   Blood Pressure (Exit) 116/70 mmHg   Heart Rate (Admit) 93 bpm   Heart Rate (Exercise) 151 bpm   Heart Rate (Exit) 87 bpm   Progression   Progression Continue to progress workloads to maintain intensity without signs/symptoms of physical distress.   Average METs 6.1   Resistance Training   Training Prescription No   Treadmill   MPH 3.6   Grade 6   Minutes 10   METs 6.73   Bike   Level 2.5   Minutes 10   METs 5.84   NuStep   Level 6   Minutes 10   METs 5.4      Nutrition:  Target Goals: Understanding of nutrition guidelines, daily intake of sodium 1500mg , cholesterol 200mg , calories 30% from fat and 7% or less from saturated fats, daily to have 5 or more servings of fruits and vegetables.  Biometrics:     Pre Biometrics - 01/30/16 1334    Pre Biometrics   Waist Circumference 37 inches   Hip Circumference 38 inches   Waist to Hip Ratio 0.97 %   Triceps Skinfold 15 mm   % Body Fat 25.6 %   Grip Strength 15 kg   Flexibility 16 in   Single Leg Stand 30 seconds       Nutrition Therapy Plan and Nutrition Goals:     Nutrition Therapy & Goals - 01/31/16 0820    Nutrition Therapy   Diet Therapeutic Lifestyle Changes   Personal Nutrition Goals   Personal Goal #1 Weight loss of 6-24 lb at graduation from Cardiac Rehab   Intervention Plan   Intervention Prescribe, educate and counsel regarding individualized specific dietary modifications aiming towards targeted core components such as  weight, hypertension, lipid management, diabetes, heart failure and other comorbidities.   Expected Outcomes Short Term Goal: Understand basic principles of dietary content, such as calories, fat, sodium, cholesterol and nutrients.;Long Term Goal: Adherence to prescribed nutrition plan.      Nutrition Discharge: Nutrition Scores:     Nutrition Assessments - 01/31/16 0819    MEDFICTS Scores   Pre Score 96      Nutrition Goals Re-Evaluation:   Psychosocial: Target Goals: Acknowledge presence or absence of depression, maximize coping skills, provide positive support system. Participant is able to verbalize types and ability to use techniques and skills needed for reducing stress and depression.  Initial Review & Psychosocial Screening:     Initial Psych Review & Screening - 02/07/16 1609    Family Dynamics   Good Support System? Yes   Barriers   Psychosocial barriers to participate in program There are no identifiable barriers or psychosocial needs.      Quality of Life Scores:     Quality of Life - 01/30/16 1545    Quality of Life Scores   Health/Function Pre 25.4 %   Socioeconomic Pre 22.71 %  Psych/Spiritual Pre 25.5 %   Family Pre 20 %   GLOBAL Pre 24.07 %      PHQ-9:     Recent Review Flowsheet Data    [facility]   Decreased Interest 0   Down, Depressed, Hopeless 0   PHQ - 2 Score 0      Psychosocial Evaluation and Intervention:   Psychosocial Re-Evaluation:   Vocational Rehabilitation: Provide vocational rehab assistance to qualifying candidates.   Vocational Rehab Evaluation & Intervention:     Vocational Rehab - 02/05/16 0754    Initial Vocational Rehab Evaluation & Intervention   Vocational Rehab Packet given to patient --  Pt completed vocational rehab form. Will fax to [phone] today (02/05/16)      Education: Education Goals: Education classes will be provided on a weekly basis, covering required topics.  Participant will state understanding/return demonstration of topics presented.  Learning Barriers/Preferences:     Learning Barriers/Preferences - 01/30/16 1544    Learning Barriers/Preferences   Learning Barriers Sight  glasses   Learning Preferences Written Material      Education Topics: Count Your Pulse:  -Group instruction provided by verbal instruction, demonstration, patient participation and written materials to support subject.  Instructors address importance of being able to find your pulse and how to count your pulse when at home without a heart monitor.  Patients get hands on experience counting their pulse with staff help and individually.   Heart Attack, Angina, and Risk Factor Modification:  -Group instruction provided by verbal instruction, video, and written materials to support subject.  Instructors address signs and symptoms of angina and heart attacks.    Also discuss risk factors for heart disease and how to make changes to improve heart health risk factors.   Functional Fitness:  -Group instruction provided by verbal instruction, demonstration, patient participation, and written materials to support subject.  Instructors address safety measures for doing things around the house.  Discuss how to get up and down off the floor, how to pick things up properly, how to safely get out of a chair without assistance, and balance training.   Meditation and Mindfulness:  -Group instruction provided by verbal instruction, patient participation, and written materials to support subject.  Instructor addresses importance of mindfulness and meditation practice to help reduce stress and improve awareness.  Instructor also leads participants through a meditation exercise.    Stretching for Flexibility and Mobility:  -Group instruction provided by verbal instruction, patient participation, and written materials to support subject.  Instructors lead participants through series of  stretches that are designed to increase flexibility thus improving mobility.  These stretches are additional exercise for major muscle groups that are typically performed during regular warm up and cool down.   Hands Only CPR Anytime:  -Group instruction provided by verbal instruction, video, patient participation and written materials to support subject.  Instructors co-teach with AHA video for hands only CPR.  Participants get hands on experience with mannequins.   Nutrition I class: Heart Healthy Eating:  -Group instruction provided by PowerPoint slides, verbal discussion, and written materials to support subject matter. The instructor gives an explanation and review of the Therapeutic Lifestyle Changes diet recommendations, which includes a discussion on lipid goals, dietary fat, sodium, fiber, plant stanol/sterol esters, sugar, and the components of a well-balanced, healthy diet.          [facility]   Date  03/13/16   Educator  RD   Instruction Review Code  Not applicable [class handouts given]      Nutrition II class: Lifestyle Skills:  -Group instruction provided by PowerPoint slides, verbal discussion, and written materials to support subject matter. The instructor gives an explanation and review of label reading, grocery shopping for heart health, heart healthy recipe modifications, and ways to make healthier choices when eating out.      CARDIAC REHAB PHASE II EXERCISE from 03/13/2016 in [facility] CARDIAC REHAB   Date  03/13/16   Educator  RD   Instruction Review Code  Not applicable [class handouts given]      Diabetes Question & Answer:  -Group instruction provided by PowerPoint slides, verbal discussion, and written materials to support subject matter. The instructor gives an explanation and review of diabetes co-morbidities, pre- and post-prandial blood glucose goals,  pre-exercise blood glucose goals, signs, symptoms, and treatment of hypoglycemia and hyperglycemia, and foot care basics.   Diabetes Blitz:  -Group instruction provided by PowerPoint slides, verbal discussion, and written materials to support subject matter. The instructor gives an explanation and review of the physiology behind type 1 and type 2 diabetes, diabetes medications and rational behind using different medications, pre- and post-prandial blood glucose recommendations and Hemoglobin A1c goals, diabetes diet, and exercise including blood glucose guidelines for exercising safely.    Portion Distortion:  -Group instruction provided by PowerPoint slides, verbal discussion, written materials, and food models to support subject matter. The instructor gives an explanation of serving size versus portion size, changes in portions sizes over the last 20 years, and what consists of a serving from each food group.   Stress Management:  -Group instruction provided by verbal instruction, video, and written materials to support subject matter.  Instructors review role of stress in heart disease and how to cope with stress positively.     Exercising on Your Own:  -Group instruction provided by verbal instruction, power point, and written materials to support subject.  Instructors discuss benefits of exercise, components of exercise, frequency and intensity of exercise, and end points for exercise.  Also discuss use of nitroglycerin and activating EMS.  Review options of places to exercise outside of rehab.  Review guidelines for sex with heart disease.   Cardiac Drugs I:  -Group instruction provided by verbal instruction and written materials to support subject.  Instructor reviews cardiac drug classes: antiplatelets, anticoagulants, beta blockers, and statins.  Instructor discusses reasons, side effects, and lifestyle considerations for each drug class.   Cardiac Drugs II:  -Group instruction  provided by verbal instruction and written materials to support subject.  Instructor reviews cardiac drug classes: angiotensin converting enzyme inhibitors (ACE-I), angiotensin II receptor blockers (ARBs), nitrates, and calcium channel blockers.  Instructor discusses reasons, side effects, and lifestyle considerations for each drug class.   Anatomy and Physiology of the Circulatory System:  -Group instruction provided by verbal instruction, video, and written materials to support subject.  Reviews functional anatomy of heart, how it relates to various diagnoses, and what role the heart plays in the overall system.   Knowledge Questionnaire Score:     Knowledge Questionnaire Score - 01/30/16 1544    Knowledge Questionnaire Score   Pre Score 23/24      Core Components/Risk Factors/Patient Goals at Admission:     Personal Goals and Risk Factors at Admission - 01/30/16 1415    Core Components/Risk Factors/Patient Goals on Admission    Weight Management Yes;Weight Loss  Intervention Weight Management: Develop a combined nutrition and exercise program designed to reach desired caloric intake, while maintaining appropriate intake of nutrient and fiber, sodium and fats, and appropriate energy expenditure required for the weight goal.;Weight Management: Provide education and appropriate resources to help participant work on and attain dietary goals.   Admit Weight 211 lb 6.7 oz (95.9 kg)   Goal Weight: Short Term 207 lb (93.895 kg)   Goal Weight: Long Term 199 lb (90.266 kg)   Expected Outcomes Weight Maintenance: Understanding of the daily nutrition guidelines, which includes 25-35% calories from fat, 7% or less cal from saturated fats, less than 200mg  cholesterol, less than 1.5gm of sodium, & 5 or more servings of fruits and vegetables daily;Short Term: Continue to assess and modify interventions until short term weight is achieved;Long Term: Adherence to nutrition and physical activity/exercise  program aimed toward attainment of established weight goal;Understanding recommendations for meals to include 15-35% energy as protein, 25-35% energy from fat, 35-60% energy from carbohydrates, less than 200mg  of dietary cholesterol, 20-35 gm of total fiber daily;Understanding of distribution of calorie intake throughout the day with the consumption of 4-5 meals/snacks;Weight Loss: Understanding of general recommendations for a balanced deficit meal plan, which promotes 1-2 lb weight loss per week and includes a negative energy balance of [id number] kcal/d   Sedentary Yes   Intervention Provide advice, education, support and counseling about physical activity/exercise needs.;Develop an individualized exercise prescription for aerobic and resistive training based on initial evaluation findings, risk stratification, comorbidities and participant's personal goals.   Expected Outcomes Achievement of increased cardiorespiratory fitness and enhanced flexibility, muscular endurance and strength shown through measurements of functional capacity and personal statement of participant.   Lipids Yes   Intervention Provide education and support for participant on nutrition & aerobic/resistive exercise along with prescribed medications to achieve LDL 70mg , HDL >40mg .   Expected Outcomes Short Term: Participant states understanding of desired cholesterol values and is compliant with medications prescribed. Participant is following exercise prescription and nutrition guidelines.;Long Term: Cholesterol controlled with medications as prescribed, with individualized exercise RX and with personalized nutrition plan. Value goals: LDL < 70mg , HDL > 40 mg.   Stress Yes   Intervention Offer individual and/or small group education and counseling on adjustment to heart disease, stress management and health-related lifestyle change. Teach and support self-help strategies.;Refer participants experiencing significant psychosocial  distress to appropriate mental health specialists for further evaluation and treatment. When possible, include family members and significant others in education/counseling sessions.   Expected Outcomes Short Term: Participant demonstrates changes in health-related behavior, relaxation and other stress management skills, ability to obtain effective social support, and compliance with psychotropic medications if prescribed.;Long Term: Emotional wellbeing is indicated by absence of clinically significant psychosocial distress or social isolation.   Personal Goal Other Yes   Personal Goal Healthier lifestyle   Intervention Provide education in exericse and nutrition to improve lifestyle   Expected Outcomes Able to maintain a healthier life long term      Core Components/Risk Factors/Patient Goals Review:      Goals and Risk Factor Review      02/14/16 0751 03/04/16 0726         Core Components/Risk Factors/Patient Goals Review   Personal Goals Review Increase Strength and Stamina Improve shortness of breath with ADL's;Increase Strength and Stamina      Review increased energy Developing home exercise program and getting a routine (already reviewed HEP- pt is still trying to find the time)  Expected Outcomes improved energy/stamina and exercise capacity established exercise routine that will be conducive to work schedule         Core Components/Risk Factors/Patient Goals at Discharge (Final Review):      Goals and Risk Factor Review - 03/04/16 0726    Core Components/Risk Factors/Patient Goals Review   Personal Goals Review Improve shortness of breath with ADL's;Increase Strength and Stamina   Review Developing home exercise program and getting a routine (already reviewed HEP- pt is still trying to find the time)   Expected Outcomes established exercise routine that will be conducive to work schedule      ITP Comments:     ITP Comments      01/30/16 1412           ITP  Comments Medical Director-Dr. Armanda Magic, MD          Comments: Pt is making expected progress toward personal goals after completing 16 sessions. Recommend continued exercise and life style modification education including  stress management and relaxation techniques to decrease cardiac risk profile. Alanson Aly, BSN

## 2016-03-22 ENCOUNTER — Encounter (HOSPITAL_COMMUNITY)
Admission: RE | Admit: 2016-03-22 | Discharge: 2016-03-22 | Disposition: A | Discharge status: Home / Self Care | Payer: BC Managed Care – PPO | Source: Ambulatory Visit | Attending: Cardiology | Admitting: Cardiology

## 2016-03-22 DIAGNOSIS — Z951 Presence of aortocoronary bypass graft: Secondary | ICD-10-CM | POA: Diagnosis not present

## 2016-03-27 ENCOUNTER — Encounter (HOSPITAL_COMMUNITY)
Admission: RE | Admit: 2016-03-27 | Discharge: 2016-03-27 | Disposition: A | Discharge status: Home / Self Care | Payer: BC Managed Care – PPO | Source: Ambulatory Visit | Attending: Cardiology | Admitting: Cardiology

## 2016-03-27 DIAGNOSIS — Z951 Presence of aortocoronary bypass graft: Secondary | ICD-10-CM

## 2016-03-29 ENCOUNTER — Encounter (HOSPITAL_COMMUNITY)
Admission: RE | Admit: 2016-03-29 | Discharge: 2016-03-29 | Disposition: A | Discharge status: Home / Self Care | Payer: BC Managed Care – PPO | Source: Ambulatory Visit | Attending: Cardiology | Admitting: Cardiology

## 2016-03-29 DIAGNOSIS — K219 Gastro-esophageal reflux disease without esophagitis: Secondary | ICD-10-CM | POA: Insufficient documentation

## 2016-03-29 DIAGNOSIS — Z7982 Long term (current) use of aspirin: Secondary | ICD-10-CM | POA: Diagnosis not present

## 2016-03-29 DIAGNOSIS — Z951 Presence of aortocoronary bypass graft: Secondary | ICD-10-CM | POA: Diagnosis not present

## 2016-03-29 DIAGNOSIS — E785 Hyperlipidemia, unspecified: Secondary | ICD-10-CM | POA: Insufficient documentation

## 2016-03-29 DIAGNOSIS — Z79899 Other long term (current) drug therapy: Secondary | ICD-10-CM | POA: Insufficient documentation

## 2016-04-01 ENCOUNTER — Encounter (HOSPITAL_COMMUNITY)
Admission: RE | Admit: 2016-04-01 | Discharge: 2016-04-01 | Disposition: A | Discharge status: Home / Self Care | Payer: BC Managed Care – PPO | Source: Ambulatory Visit | Attending: Cardiology | Admitting: Cardiology

## 2016-04-01 DIAGNOSIS — Z951 Presence of aortocoronary bypass graft: Secondary | ICD-10-CM

## 2016-04-03 ENCOUNTER — Encounter (HOSPITAL_COMMUNITY)
Admission: RE | Admit: 2016-04-03 | Discharge: 2016-04-03 | Disposition: A | Discharge status: Home / Self Care | Payer: BC Managed Care – PPO | Source: Ambulatory Visit | Attending: Cardiology | Admitting: Cardiology

## 2016-04-03 DIAGNOSIS — Z951 Presence of aortocoronary bypass graft: Secondary | ICD-10-CM | POA: Diagnosis not present

## 2016-04-05 ENCOUNTER — Encounter (HOSPITAL_COMMUNITY)
Admission: RE | Admit: 2016-04-05 | Discharge: 2016-04-05 | Disposition: A | Discharge status: Home / Self Care | Payer: BC Managed Care – PPO | Source: Ambulatory Visit | Attending: Cardiology | Admitting: Cardiology

## 2016-04-05 DIAGNOSIS — Z951 Presence of aortocoronary bypass graft: Secondary | ICD-10-CM

## 2016-04-08 ENCOUNTER — Encounter (HOSPITAL_COMMUNITY)
Admission: RE | Admit: 2016-04-08 | Discharge: 2016-04-08 | Disposition: A | Discharge status: Home / Self Care | Payer: BC Managed Care – PPO | Source: Ambulatory Visit | Attending: Cardiology | Admitting: Cardiology

## 2016-04-08 DIAGNOSIS — Z951 Presence of aortocoronary bypass graft: Secondary | ICD-10-CM

## 2016-04-10 ENCOUNTER — Encounter (HOSPITAL_COMMUNITY)
Admission: RE | Admit: 2016-04-10 | Discharge: 2016-04-10 | Disposition: A | Discharge status: Home / Self Care | Payer: BC Managed Care – PPO | Source: Ambulatory Visit | Attending: Cardiology | Admitting: Cardiology

## 2016-04-10 DIAGNOSIS — Z951 Presence of aortocoronary bypass graft: Secondary | ICD-10-CM

## 2016-04-12 ENCOUNTER — Encounter (HOSPITAL_COMMUNITY): Payer: BC Managed Care – PPO

## 2016-04-15 ENCOUNTER — Encounter (HOSPITAL_COMMUNITY)
Admission: RE | Admit: 2016-04-15 | Discharge: 2016-04-15 | Disposition: A | Discharge status: Home / Self Care | Payer: BC Managed Care – PPO | Source: Ambulatory Visit | Attending: Cardiology | Admitting: Cardiology

## 2016-04-15 DIAGNOSIS — Z951 Presence of aortocoronary bypass graft: Secondary | ICD-10-CM

## 2016-04-17 ENCOUNTER — Encounter (HOSPITAL_COMMUNITY)
Admission: RE | Admit: 2016-04-17 | Discharge: 2016-04-17 | Disposition: A | Discharge status: Home / Self Care | Payer: BC Managed Care – PPO | Source: Ambulatory Visit | Attending: Cardiology | Admitting: Cardiology

## 2016-04-17 DIAGNOSIS — Z951 Presence of aortocoronary bypass graft: Secondary | ICD-10-CM | POA: Diagnosis not present

## 2016-04-18 NOTE — Progress Notes (Signed)
Cardiac Individual Treatment Plan  Patient Details  Name: Adam Taylor MRN: [id number] Date of Birth: 1965-11-14 Referring Provider:        CARDIAC REHAB PHASE II EXERCISE from 02/07/2016 in [facility] CARDIAC REHAB   Referring Provider  Ellwood Handler MD      Initial Encounter Date:       CARDIAC REHAB PHASE II EXERCISE from 02/07/2016 in [facility] CARDIAC REHAB   Date  01/30/16   Referring Provider  Ellwood Handler MD      Visit Diagnosis: S/P CABG x 3  Patient's Home Medications on Admission:  Current outpatient prescriptions:  .  aspirin EC 325 MG EC tablet, Take 1 tablet (325 mg total) by mouth daily., Disp: , Rfl:  .  atorvastatin (LIPITOR) 80 MG tablet, Take 1 tablet (80 mg total) by mouth daily at 6 PM., Disp: 30 tablet, Rfl: 1 .  lisinopril (PRINIVIL,ZESTRIL) 5 MG tablet, Take 1 tablet (5 mg total) by mouth daily., Disp: 30 tablet, Rfl: 1 .  metoprolol tartrate (LOPRESSOR) 25 MG tablet, Take 0.5 tablets (12.5 mg total) by mouth 2 (two) times daily. (Patient not taking: Reported on 02/05/2016), Disp: 31 tablet, Rfl: 1 .  omeprazole (PRILOSEC) 20 MG capsule, Take 20 mg by mouth daily as needed., Disp: , Rfl:   Past Medical History: Past Medical History  Diagnosis Date  . Hyperlipidemia   . GERD without esophagitis     Tobacco Use: History  Smoking status  . Never Smoker   Smokeless tobacco  . Never Used    Labs: Recent Review Flowsheet Data    Labs for ITP Cardiac and Pulmonary Rehab Latest Ref Rng 12/16/2015 12/16/2015 12/16/2015 12/16/2015 12/17/2015   PHART 7.350 - 7.450 7.253(L) 7.402 7.283(L) - -   PCO2ART 35.0 - 45.0 mmHg 54.6(H) 31.1(L) 46.2(H) - -   HCO3 20.0 - 24.0 mEq/L 24.5(H) 19.6(L) 22.2 - -   TCO2 0 - 100 mmol/L 26 21 24 24 24    ACIDBASEDEF 0.0 - 2.0 mmol/L 3.0(H) 5.0(H) 5.0(H) - -   O2SAT - 98.0 99.0 90.0 - -      Capillary Blood Glucose: Lab Results  Component Value Date   GLUCAP 91 12/19/2015    GLUCAP 90 12/18/2015   GLUCAP 93 12/18/2015   GLUCAP 81 12/17/2015   GLUCAP 147* 12/17/2015     Exercise Target Goals:    Exercise Program Goal: Individual exercise prescription set with THRR, safety & activity barriers. Participant demonstrates ability to understand and report RPE using BORG scale, to self-measure pulse accurately, and to acknowledge the importance of the exercise prescription.  Exercise Prescription Goal: Starting with aerobic activity 30 plus minutes a day, 3 days per week for initial exercise prescription. Provide home exercise prescription and guidelines that participant acknowledges understanding prior to discharge.  Activity Barriers & Risk Stratification:     Activity Barriers & Cardiac Risk Stratification - 01/30/16 1413    Activity Barriers & Cardiac Risk Stratification   Activity Barriers None   Cardiac Risk Stratification High      6 Minute Walk:     6 Minute Walk      01/30/16 1539       6 Minute Walk   Phase Initial     Distance 2300 feet     Walk Time 6 minutes     # of Rest Breaks 0     MPH 4.36     METS 5.97     RPE 9  VO2 Peak 20.87     Symptoms No     Resting HR 98 bpm     Resting BP 122/60 mmHg     Max Ex. HR 134 bpm     Max Ex. BP 128/78 mmHg     2 Minute Post BP 114/74 mmHg        Initial Exercise Prescription:     Initial Exercise Prescription - 02/08/16 1200    Date of Initial Exercise RX and Referring Provider   Date 01/30/16   Referring Provider Ellwood Handler MD   Treadmill   MPH 3.5   Grade 3   Minutes 10   METs 5.13   Bike   Level 2.5   Minutes 10   METs 5.9   NuStep   Level 4   Minutes 10   METs 3   Prescription Details   Frequency (times per week) 3   Intensity   THRR 40-80% of Max Heartrate 68-135   Ratings of Perceived Exertion 11-13   Progression   Progression Continue to progress workloads to maintain intensity without signs/symptoms of physical distress.   Resistance Training    Training Prescription Yes   Weight 3 lbs   Reps 10-12      Perform Capillary Blood Glucose checks as needed.  Exercise Prescription Changes:     Exercise Prescription Changes      02/14/16 0700 02/22/16 0700 02/28/16 1700 03/13/16 1000 04/01/16 1200   Exercise Review   Progression Yes Yes Yes Yes Yes   Response to Exercise   Blood Pressure (Admit) 110/74 mmHg 136/84 mmHg 136/84 mmHg 142/88 mmHg 104/60 mmHg   Blood Pressure (Exercise) 136/64 mmHg 128/78 mmHg 128/78 mmHg 158/70 mmHg 144/74 mmHg   Blood Pressure (Exit) 126/60 mmHg 102/62 mmHg 104/70 mmHg 116/70 mmHg 110/74 mmHg   Heart Rate (Admit) 74 bpm 72 bpm 72 bpm 93 bpm 67 bpm   Heart Rate (Exercise) 116 bpm 141 bpm 136 bpm 151 bpm 126 bpm   Heart Rate (Exit) 67 bpm 80 bpm 80 bpm 87 bpm 75 bpm   Rating of Perceived Exertion (Exercise)     12   Duration     Progress to 30 minutes of continuous aerobic without signs/symptoms of physical distress   Intensity     THRR unchanged   Progression   Progression Continue to progress workloads to maintain intensity without signs/symptoms of physical distress. Continue to progress workloads to maintain intensity without signs/symptoms of physical distress. Continue to progress workloads to maintain intensity without signs/symptoms of physical distress. Continue to progress workloads to maintain intensity without signs/symptoms of physical distress. Continue to progress workloads to maintain intensity without signs/symptoms of physical distress.   Average METs 4.1 4.6 5.4 6.1 6.1   Resistance Training   Training Prescription Yes Yes Yes No Yes   Weight 4lba 5lbs 5LBS  6lbs   Reps 10-12 10-12 10-12  10-12   Treadmill   MPH 3.3  decr. speed due to incr. HR at check in on first day 3.6 3.6 3.6 3.6   Grade Minutes METs 4.89  dec. METs due to a change in speed on TM 6.24 6.24 6.73 6.73   Bike   Level 2  decr. WL due to incr HR at check in on first day 2.5 2.5 2.5  2.5   Minutes METs 4.55  dec. METs due to  a change in speed/level on bike 5.84 5.84 5.84 5.84   NuStep   Level 4 6 6 6 6    Minutes 10 10 10 10 10    METs 3.3 4.6 4.6 5.4 6   Home Exercise Plan   Plans to continue exercise at     Home  HEP reviewed on 02/19/16. See progress note   Frequency     Add 2 additional days to program exercise sessions.     04/08/16 1700           Exercise Review   Progression Yes       Response to Exercise   Blood Pressure (Admit) 132/78 mmHg       Blood Pressure (Exercise) 138/60 mmHg       Blood Pressure (Exit) 110/60 mmHg       Heart Rate (Admit) 72 bpm       Heart Rate (Exercise) 122 bpm       Heart Rate (Exit) 76 bpm       Rating of Perceived Exertion (Exercise) 12       Duration Progress to 30 minutes of continuous aerobic without signs/symptoms of physical distress       Intensity THRR unchanged       Progression   Progression Continue to progress workloads to maintain intensity without signs/symptoms of physical distress.       Average METs 6.3       Resistance Training   Training Prescription Yes       Weight 6lbs       Reps 10-12       Treadmill   MPH 3.6       Grade 6       Minutes 10       METs 6.73       Bike   Level 2.5       Minutes 10       METs 5.85       NuStep   Level 7       Minutes 10       METs 6.7       Home Exercise Plan   Plans to continue exercise at Home  HEP reviewed on 02/19/16. See progress note       Frequency Add 2 additional days to program exercise sessions.          Exercise Comments:     Exercise Comments      02/14/16 1201 02/22/16 0733 03/13/16 1014 04/10/16 0820     Exercise Comments Reviewed METs and goals, will continue to monitor exercise progression pt is tolerating exercise well, Will continue to monitor exercise progresssion Reviewed METs and goals with pt. pt is tolerating exercise in cardiac rehab well. Will continue to monitor exercise progression Reviewed METs and  goals with pt. pt is tolerating exercise in cardiac rehab well. Will continue to monitor exercise progression       Discharge Exercise Prescription (Final Exercise Prescription Changes):     Exercise Prescription Changes - 04/08/16 1700    Exercise Review   Progression Yes   Response to Exercise   Blood Pressure (Admit) 132/78 mmHg   Blood Pressure (Exercise) 138/60 mmHg   Blood Pressure (Exit) 110/60 mmHg   Heart Rate (Admit) 72 bpm   Heart Rate (Exercise) 122 bpm   Heart Rate (Exit) 76 bpm   Rating of Perceived Exertion (Exercise) 12   Duration Progress to 30 minutes of continuous aerobic without signs/symptoms of physical distress  Intensity THRR unchanged   Progression   Progression Continue to progress workloads to maintain intensity without signs/symptoms of physical distress.   Average METs 6.3   Resistance Training   Training Prescription Yes   Weight 6lbs   Reps 10-12   Treadmill   MPH 3.6   Grade 6   Minutes 10   METs 6.73   Bike   Level 2.5   Minutes 10   METs 5.85   NuStep   Level 7   Minutes 10   METs 6.7   Home Exercise Plan   Plans to continue exercise at Home  HEP reviewed on 02/19/16. See progress note   Frequency Add 2 additional days to program exercise sessions.      Nutrition:  Target Goals: Understanding of nutrition guidelines, daily intake of sodium 1500mg , cholesterol 200mg , calories 30% from fat and 7% or less from saturated fats, daily to have 5 or more servings of fruits and vegetables.  Biometrics:     Pre Biometrics - 01/30/16 1334    Pre Biometrics   Waist Circumference 37 inches   Hip Circumference 38 inches   Waist to Hip Ratio 0.97 %   Triceps Skinfold 15 mm   % Body Fat 25.6 %   Grip Strength 15 kg   Flexibility 16 in   Single Leg Stand 30 seconds       Nutrition Therapy Plan and Nutrition Goals:     Nutrition Therapy & Goals - 01/31/16 0820    Nutrition Therapy   Diet Therapeutic Lifestyle Changes    Personal Nutrition Goals   Personal Goal #1 Weight loss of 6-24 lb at graduation from Cardiac Rehab   Intervention Plan   Intervention Prescribe, educate and counsel regarding individualized specific dietary modifications aiming towards targeted core components such as weight, hypertension, lipid management, diabetes, heart failure and other comorbidities.   Expected Outcomes Short Term Goal: Understand basic principles of dietary content, such as calories, fat, sodium, cholesterol and nutrients.;Long Term Goal: Adherence to prescribed nutrition plan.      Nutrition Discharge: Nutrition Scores:     Nutrition Assessments - 01/31/16 0819    MEDFICTS Scores   Pre Score 96      Nutrition Goals Re-Evaluation:   Psychosocial: Target Goals: Acknowledge presence or absence of depression, maximize coping skills, provide positive support system. Participant is able to verbalize types and ability to use techniques and skills needed for reducing stress and depression.  Initial Review & Psychosocial Screening:     Initial Psych Review & Screening - 02/07/16 1609    Family Dynamics   Good Support System? Yes   Barriers   Psychosocial barriers to participate in program There are no identifiable barriers or psychosocial needs.      Quality of Life Scores:     Quality of Life - 01/30/16 1545    Quality of Life Scores   Health/Function Pre 25.4 %   Socioeconomic Pre 22.71 %   Psych/Spiritual Pre 25.5 %   Family Pre 20 %   GLOBAL Pre 24.07 %      PHQ-9:     Recent Review Flowsheet Data    Depression screen PHQ 2/9 02/05/2016   Decreased Interest 0   Down, Depressed, Hopeless 0   PHQ - 2 Score 0      Psychosocial Evaluation and Intervention:   Psychosocial Re-Evaluation:   Vocational Rehabilitation: Provide vocational rehab assistance to qualifying candidates.   Vocational Rehab Evaluation & Intervention:  Vocational Rehab - 02/05/16 0754    Initial Vocational  Rehab Evaluation & Intervention   Vocational Rehab Packet given to patient --  Pt completed vocational rehab form. Will fax to [phone] today (02/05/16)      Education: Education Goals: Education classes will be provided on a weekly basis, covering required topics. Participant will state understanding/return demonstration of topics presented.  Learning Barriers/Preferences:     Learning Barriers/Preferences - 01/30/16 1544    Learning Barriers/Preferences   Learning Barriers Sight  glasses   Learning Preferences Written Material      Education Topics: Count Your Pulse:  -Group instruction provided by verbal instruction, demonstration, patient participation and written materials to support subject.  Instructors address importance of being able to find your pulse and how to count your pulse when at home without a heart monitor.  Patients get hands on experience counting their pulse with staff help and individually.   Heart Attack, Angina, and Risk Factor Modification:  -Group instruction provided by verbal instruction, video, and written materials to support subject.  Instructors address signs and symptoms of angina and heart attacks.    Also discuss risk factors for heart disease and how to make changes to improve heart health risk factors.   Functional Fitness:  -Group instruction provided by verbal instruction, demonstration, patient participation, and written materials to support subject.  Instructors address safety measures for doing things around the house.  Discuss how to get up and down off the floor, how to pick things up properly, how to safely get out of a chair without assistance, and balance training.   Meditation and Mindfulness:  -Group instruction provided by verbal instruction, patient participation, and written materials to support subject.  Instructor addresses importance of mindfulness and meditation practice to help reduce stress and improve awareness.   Instructor also leads participants through a meditation exercise.    Stretching for Flexibility and Mobility:  -Group instruction provided by verbal instruction, patient participation, and written materials to support subject.  Instructors lead participants through series of stretches that are designed to increase flexibility thus improving mobility.  These stretches are additional exercise for major muscle groups that are typically performed during regular warm up and cool down.   Hands Only CPR Anytime:  -Group instruction provided by verbal instruction, video, patient participation and written materials to support subject.  Instructors co-teach with AHA video for hands only CPR.  Participants get hands on experience with mannequins.   Nutrition I class: Heart Healthy Eating:  -Group instruction provided by PowerPoint slides, verbal discussion, and written materials to support subject matter. The instructor gives an explanation and review of the Therapeutic Lifestyle Changes diet recommendations, which includes a discussion on lipid goals, dietary fat, sodium, fiber, plant stanol/sterol esters, sugar, and the components of a well-balanced, healthy diet.          CARDIAC REHAB PHASE II EXERCISE from 03/13/2016 in [facility] CARDIAC REHAB   Date  03/13/16   Educator  RD   Instruction Review Code  Not applicable [class handouts given]      Nutrition II class: Lifestyle Skills:  -Group instruction provided by PowerPoint slides, verbal discussion, and written materials to support subject matter. The instructor gives an explanation and review of label reading, grocery shopping for heart health, heart healthy recipe modifications, and ways to make healthier choices when eating out.      CARDIAC REHAB PHASE II EXERCISE from 03/13/2016 in [facility] CARDIAC [facility]  Date  03/13/16   Educator  RD   Instruction Review Code  Not applicable [class handouts given]       Diabetes Question & Answer:  -Group instruction provided by PowerPoint slides, verbal discussion, and written materials to support subject matter. The instructor gives an explanation and review of diabetes co-morbidities, pre- and post-prandial blood glucose goals, pre-exercise blood glucose goals, signs, symptoms, and treatment of hypoglycemia and hyperglycemia, and foot care basics.   Diabetes Blitz:  -Group instruction provided by PowerPoint slides, verbal discussion, and written materials to support subject matter. The instructor gives an explanation and review of the physiology behind type 1 and type 2 diabetes, diabetes medications and rational behind using different medications, pre- and post-prandial blood glucose recommendations and Hemoglobin A1c goals, diabetes diet, and exercise including blood glucose guidelines for exercising safely.    Portion Distortion:  -Group instruction provided by PowerPoint slides, verbal discussion, written materials, and food models to support subject matter. The instructor gives an explanation of serving size versus portion size, changes in portions sizes over the last 20 years, and what consists of a serving from each food group.   Stress Management:  -Group instruction provided by verbal instruction, video, and written materials to support subject matter.  Instructors review role of stress in heart disease and how to cope with stress positively.     Exercising on Your Own:  -Group instruction provided by verbal instruction, power point, and written materials to support subject.  Instructors discuss benefits of exercise, components of exercise, frequency and intensity of exercise, and end points for exercise.  Also discuss use of nitroglycerin and activating EMS.  Review options of places to exercise outside of rehab.  Review guidelines for sex with heart disease.   Cardiac Drugs I:  -Group instruction provided by verbal instruction and written  materials to support subject.  Instructor reviews cardiac drug classes: antiplatelets, anticoagulants, beta blockers, and statins.  Instructor discusses reasons, side effects, and lifestyle considerations for each drug class.   Cardiac Drugs II:  -Group instruction provided by verbal instruction and written materials to support subject.  Instructor reviews cardiac drug classes: angiotensin converting enzyme inhibitors (ACE-I), angiotensin II receptor blockers (ARBs), nitrates, and calcium channel blockers.  Instructor discusses reasons, side effects, and lifestyle considerations for each drug class.   Anatomy and Physiology of the Circulatory System:  -Group instruction provided by verbal instruction, video, and written materials to support subject.  Reviews functional anatomy of heart, how it relates to various diagnoses, and what role the heart plays in the overall system.   Knowledge Questionnaire Score:     Knowledge Questionnaire Score - 01/30/16 1544    Knowledge Questionnaire Score   Pre Score 23/24      Core Components/Risk Factors/Patient Goals at Admission:     Personal Goals and Risk Factors at Admission - 01/30/16 1415    Core Components/Risk Factors/Patient Goals on Admission    Weight Management Yes;Weight Loss   Intervention Weight Management: Develop a combined nutrition and exercise program designed to reach desired caloric intake, while maintaining appropriate intake of nutrient and fiber, sodium and fats, and appropriate energy expenditure required for the weight goal.;Weight Management: Provide education and appropriate resources to help participant work on and attain dietary goals.   Admit Weight 211 lb 6.7 oz (95.9 kg)   Goal Weight: Short Term 207 lb (93.895 kg)   Goal Weight: Long Term 199 lb (90.266 kg)   Expected Outcomes Weight Maintenance: Understanding of the  daily nutrition guidelines, which includes 25-35% calories from fat, 7% or less cal from saturated  fats, less than 200mg  cholesterol, less than 1.5gm of sodium, & 5 or more servings of fruits and vegetables daily;Short Term: Continue to assess and modify interventions until short term weight is achieved;Long Term: Adherence to nutrition and physical activity/exercise program aimed toward attainment of established weight goal;Understanding recommendations for meals to include 15-35% energy as protein, 25-35% energy from fat, 35-60% energy from carbohydrates, less than 200mg  of dietary cholesterol, 20-35 gm of total fiber daily;Understanding of distribution of calorie intake throughout the day with the consumption of 4-5 meals/snacks;Weight Loss: Understanding of general recommendations for a balanced deficit meal plan, which promotes 1-2 lb weight loss per week and includes a negative energy balance of [phone] kcal/d   Sedentary Yes   Intervention Provide advice, education, support and counseling about physical activity/exercise needs.;Develop an individualized exercise prescription for aerobic and resistive training based on initial evaluation findings, risk stratification, comorbidities and participant's personal goals.   Expected Outcomes Achievement of increased cardiorespiratory fitness and enhanced flexibility, muscular endurance and strength shown through measurements of functional capacity and personal statement of participant.   Lipids Yes   Intervention Provide education and support for participant on nutrition & aerobic/resistive exercise along with prescribed medications to achieve LDL 70mg , HDL >40mg .   Expected Outcomes Short Term: Participant states understanding of desired cholesterol values and is compliant with medications prescribed. Participant is following exercise prescription and nutrition guidelines.;Long Term: Cholesterol controlled with medications as prescribed, with individualized exercise RX and with personalized nutrition plan. Value goals: LDL < 70mg , HDL > 40 mg.   Stress  Yes   Intervention Offer individual and/or small group education and counseling on adjustment to heart disease, stress management and health-related lifestyle change. Teach and support self-help strategies.;Refer participants experiencing significant psychosocial distress to appropriate mental health specialists for further evaluation and treatment. When possible, include family members and significant others in education/counseling sessions.   Expected Outcomes Short Term: Participant demonstrates changes in health-related behavior, relaxation and other stress management skills, ability to obtain effective social support, and compliance with psychotropic medications if prescribed.;Long Term: Emotional wellbeing is indicated by absence of clinically significant psychosocial distress or social isolation.   Personal Goal Other Yes   Personal Goal Healthier lifestyle   Intervention Provide education in exericse and nutrition to improve lifestyle   Expected Outcomes Able to maintain a healthier life long term      Core Components/Risk Factors/Patient Goals Review:      Goals and Risk Factor Review      02/14/16 0751 03/04/16 0726 04/10/16 0820       Core Components/Risk Factors/Patient Goals Review   Personal Goals Review Increase Strength and Stamina Improve shortness of breath with ADL's;Increase Strength and Stamina Weight Management/Obesity     Review increased energy Developing home exercise program and getting a routine (already reviewed HEP- pt is still trying to find the time)      Expected Outcomes improved energy/stamina and exercise capacity established exercise routine that will be conducive to work schedule Pt weight is hovering between 205-210. A bit irritated with weightloss. Encouraged exercise and nutrition. Pt understands.        Core Components/Risk Factors/Patient Goals at Discharge (Final Review):      Goals and Risk Factor Review - 04/10/16 0820    Core Components/Risk  Factors/Patient Goals Review   Personal Goals Review Weight Management/Obesity   Expected Outcomes Pt weight is hovering between  205-210. A bit irritated with weightloss. Encouraged exercise and nutrition. Pt understands.      ITP Comments:     ITP Comments      01/30/16 1412           ITP Comments Medical Director-Dr. Armanda Magic, MD          Comments:  Pt is making expected progress toward personal goals after completing 28 sessions. Improved compliance with medications.Recommend continued exercise and life style modification education including  stress management and relaxation techniques to decrease cardiac risk profile.Alanson Aly, BSN

## 2016-04-19 ENCOUNTER — Encounter (HOSPITAL_COMMUNITY)
Admission: RE | Admit: 2016-04-19 | Discharge: 2016-04-19 | Disposition: A | Discharge status: Home / Self Care | Payer: BC Managed Care – PPO | Source: Ambulatory Visit | Attending: Cardiology | Admitting: Cardiology

## 2016-04-19 DIAGNOSIS — Z951 Presence of aortocoronary bypass graft: Secondary | ICD-10-CM | POA: Diagnosis not present

## 2016-04-22 ENCOUNTER — Encounter (HOSPITAL_COMMUNITY)
Admission: RE | Admit: 2016-04-22 | Discharge: 2016-04-22 | Disposition: A | Discharge status: Home / Self Care | Payer: BC Managed Care – PPO | Source: Ambulatory Visit | Attending: Cardiology | Admitting: Cardiology

## 2016-04-22 DIAGNOSIS — Z951 Presence of aortocoronary bypass graft: Secondary | ICD-10-CM

## 2016-04-24 ENCOUNTER — Encounter (HOSPITAL_COMMUNITY)
Admission: RE | Admit: 2016-04-24 | Discharge: 2016-04-24 | Disposition: A | Discharge status: Home / Self Care | Payer: BC Managed Care – PPO | Source: Ambulatory Visit | Attending: Cardiology | Admitting: Cardiology

## 2016-04-24 DIAGNOSIS — Z951 Presence of aortocoronary bypass graft: Secondary | ICD-10-CM | POA: Diagnosis not present

## 2016-04-26 ENCOUNTER — Encounter (HOSPITAL_COMMUNITY)
Admission: RE | Admit: 2016-04-26 | Discharge: 2016-04-26 | Disposition: A | Discharge status: Home / Self Care | Payer: BC Managed Care – PPO | Source: Ambulatory Visit | Attending: Cardiology | Admitting: Cardiology

## 2016-04-26 DIAGNOSIS — Z951 Presence of aortocoronary bypass graft: Secondary | ICD-10-CM | POA: Diagnosis not present

## 2016-04-29 ENCOUNTER — Encounter (HOSPITAL_COMMUNITY): Payer: BC Managed Care – PPO

## 2016-05-01 ENCOUNTER — Encounter (HOSPITAL_COMMUNITY): Admission: RE | Admit: 2016-05-01 | Payer: BC Managed Care – PPO | Source: Ambulatory Visit

## 2016-05-02 ENCOUNTER — Other Ambulatory Visit: Payer: Self-pay | Admitting: Cardiothoracic Surgery

## 2016-05-03 ENCOUNTER — Encounter (HOSPITAL_COMMUNITY)
Admission: RE | Admit: 2016-05-03 | Discharge: 2016-05-03 | Disposition: A | Discharge status: Home / Self Care | Payer: BC Managed Care – PPO | Source: Ambulatory Visit | Attending: Cardiology | Admitting: Cardiology

## 2016-05-03 DIAGNOSIS — Z951 Presence of aortocoronary bypass graft: Secondary | ICD-10-CM | POA: Insufficient documentation

## 2016-05-03 DIAGNOSIS — Z79899 Other long term (current) drug therapy: Secondary | ICD-10-CM | POA: Insufficient documentation

## 2016-05-03 DIAGNOSIS — Z7982 Long term (current) use of aspirin: Secondary | ICD-10-CM | POA: Diagnosis not present

## 2016-05-03 DIAGNOSIS — K219 Gastro-esophageal reflux disease without esophagitis: Secondary | ICD-10-CM | POA: Diagnosis not present

## 2016-05-03 DIAGNOSIS — E785 Hyperlipidemia, unspecified: Secondary | ICD-10-CM | POA: Diagnosis not present

## 2016-05-06 ENCOUNTER — Encounter (HOSPITAL_COMMUNITY)
Admission: RE | Admit: 2016-05-06 | Discharge: 2016-05-06 | Disposition: A | Discharge status: Home / Self Care | Payer: BC Managed Care – PPO | Source: Ambulatory Visit | Attending: Cardiology | Admitting: Cardiology

## 2016-05-06 DIAGNOSIS — Z951 Presence of aortocoronary bypass graft: Secondary | ICD-10-CM | POA: Diagnosis not present

## 2016-05-08 ENCOUNTER — Encounter (HOSPITAL_COMMUNITY)
Admission: RE | Admit: 2016-05-08 | Discharge: 2016-05-08 | Disposition: A | Discharge status: Home / Self Care | Payer: BC Managed Care – PPO | Source: Ambulatory Visit | Attending: Cardiology | Admitting: Cardiology

## 2016-05-08 DIAGNOSIS — Z951 Presence of aortocoronary bypass graft: Secondary | ICD-10-CM | POA: Diagnosis not present

## 2016-05-10 ENCOUNTER — Encounter (HOSPITAL_COMMUNITY)
Admission: RE | Admit: 2016-05-10 | Discharge: 2016-05-10 | Disposition: A | Discharge status: Home / Self Care | Payer: BC Managed Care – PPO | Source: Ambulatory Visit | Attending: Cardiology | Admitting: Cardiology

## 2016-05-10 DIAGNOSIS — Z951 Presence of aortocoronary bypass graft: Secondary | ICD-10-CM | POA: Diagnosis not present

## 2016-05-13 ENCOUNTER — Encounter (HOSPITAL_COMMUNITY)
Admission: RE | Admit: 2016-05-13 | Discharge: 2016-05-13 | Disposition: A | Discharge status: Home / Self Care | Payer: BC Managed Care – PPO | Source: Ambulatory Visit | Attending: Cardiology | Admitting: Cardiology

## 2016-05-13 VITALS — Ht 71.75 in | Wt 209.4 lb

## 2016-05-13 DIAGNOSIS — Z951 Presence of aortocoronary bypass graft: Secondary | ICD-10-CM

## 2016-05-13 NOTE — Progress Notes (Signed)
Discharge Summary  Patient Details  Name: Adam Taylor MRN: [id number] Date of Birth: 02-03-1966 Referring Provider:   Flowsheet Row CARDIAC REHAB PHASE II EXERCISE from 02/07/2016 in [place]  Referring Provider  Landry Corporal MD       Number of Visits: 36  Reason for Discharge:  Patient reached a stable level of exercise. Patient independent in their exercise.  Smoking History:  History  Smoking Status  . Never Smoker  Smokeless Tobacco  . Never Used    Diagnosis:  S/P CABG x 3  ADL UCSD:   Initial Exercise Prescription:     Initial Exercise Prescription - 02/08/16 1200      Date of Initial Exercise RX and Referring Provider   Date 01/30/16   Referring Provider Landry Corporal MD     Treadmill   MPH 3.5   Grade 3   Minutes 10   METs 5.13     Bike   Level 2.5   Minutes 10   METs 5.9     NuStep   Level 4   Minutes 10   METs 3     Prescription Details   Frequency (times per week) 3     Intensity   THRR 40-80% of Max Heartrate 68-135   Ratings of Perceived Exertion 11-13     Progression   Progression Continue to progress workloads to maintain intensity without signs/symptoms of physical distress.     Resistance Training   Training Prescription Yes   Weight 3 lbs   Reps 10-12      Discharge Exercise Prescription (Final Exercise Prescription Changes):     Exercise Prescription Changes - 05/16/16 1100      Exercise Review   Progression Yes     Response to Exercise   Blood Pressure (Admit) 144/80   Blood Pressure (Exercise) 138/70   Blood Pressure (Exit) 102/60   Heart Rate (Admit) 91 bpm   Heart Rate (Exercise) 141 bpm   Heart Rate (Exit) 91 bpm   Rating of Perceived Exertion (Exercise) 13   Duration Progress to 30 minutes of continuous aerobic without signs/symptoms of physical distress   Intensity THRR unchanged     Progression   Progression Continue to progress workloads to maintain  intensity without signs/symptoms of physical distress.   Average METs 7.4     Resistance Training   Training Prescription Yes   Weight 6lbs   Reps 10-12     Treadmill   MPH 3.5   Grade 10   Minutes 10   METs 8.5     Bike   Level 4.5   Minutes 10   METs 9.74     NuStep   Level 10   Minutes 10   METs 6.3     Home Exercise Plan   Plans to continue exercise at Home  Reviewed on 4/24. See progress note   Frequency Add 2 additional days to program exercise sessions.      Functional Capacity:     6 Minute Walk    Row Name 01/30/16 1539 05/16/16 1134 05/16/16 1222     6 Minute Walk   Phase Initial Discharge  -   Distance 2300 feet 2331 feet  -   Walk Time 6 minutes 6 minutes  -   # of Rest Breaks 0 0  -   MPH 4.36 4.4  -   METS 5.97 5.97  -   RPE 9 9  -   VO2  Peak 20.87 20.9  -   Symptoms No  - No   Resting HR 98 bpm 71 bpm  -   Resting BP 122/60 114/76  -   Max Ex. HR 134 bpm 120 bpm  -   Max Ex. BP 128/78 136/70  -   2 Minute Post BP 114/74 90/68  98/62 after drinking water, asymptomatic  -      Psychological, QOL, Others - Outcomes: PHQ 2/9: Depression screen [facility] 2/9 05/13/2016 02/05/2016  Decreased Interest 0 0  Down, Depressed, Hopeless 0 0  PHQ - 2 Score 0 0    Quality of Life:     Quality of Life - 05/16/16 1152      Quality of Life Scores   Health/Function Post 25.1 %   Socioeconomic Post 22.64 %   Psych/Spiritual Post 22.21 %   Family Post 16.5 %   GLOBAL Post 22.74 %      Personal Goals: Goals established at orientation with interventions provided to work toward goal.     Personal Goals and Risk Factors at Admission - 01/30/16 1415      Core Components/Risk Factors/Patient Goals on Admission    Weight Management Yes;Weight Loss   Intervention Weight Management: Develop a combined nutrition and exercise program designed to reach desired caloric intake, while maintaining appropriate intake of nutrient and fiber, sodium and fats, and  appropriate energy expenditure required for the weight goal.;Weight Management: Provide education and appropriate resources to help participant work on and attain dietary goals.   Admit Weight 211 lb 6.7 oz (95.9 kg)   Goal Weight: Short Term 207 lb (93.9 kg)   Goal Weight: Long Term 199 lb (90.3 kg)   Expected Outcomes Weight Maintenance: Understanding of the daily nutrition guidelines, which includes 25-35% calories from fat, 7% or less cal from saturated fats, less than '200mg'$  cholesterol, less than 1.5gm of sodium, & 5 or more servings of fruits and vegetables daily;Short Term: Continue to assess and modify interventions until short term weight is achieved;Long Term: Adherence to nutrition and physical activity/exercise program aimed toward attainment of established weight goal;Understanding recommendations for meals to include 15-35% energy as protein, 25-35% energy from fat, 35-60% energy from carbohydrates, less than '200mg'$  of dietary cholesterol, 20-35 gm of total fiber daily;Understanding of distribution of calorie intake throughout the day with the consumption of 4-5 meals/snacks;Weight Loss: Understanding of general recommendations for a balanced deficit meal plan, which promotes 1-2 lb weight loss per week and includes a negative energy balance of [phone] kcal/d   Sedentary Yes   Intervention Provide advice, education, support and counseling about physical activity/exercise needs.;Develop an individualized exercise prescription for aerobic and resistive training based on initial evaluation findings, risk stratification, comorbidities and participant's personal goals.   Expected Outcomes Achievement of increased cardiorespiratory fitness and enhanced flexibility, muscular endurance and strength shown through measurements of functional capacity and personal statement of participant.   Lipids Yes   Intervention Provide education and support for participant on nutrition & aerobic/resistive exercise  along with prescribed medications to achieve LDL '70mg'$ , HDL >'40mg'$ .   Expected Outcomes Short Term: Participant states understanding of desired cholesterol values and is compliant with medications prescribed. Participant is following exercise prescription and nutrition guidelines.;Long Term: Cholesterol controlled with medications as prescribed, with individualized exercise RX and with personalized nutrition plan. Value goals: LDL < '70mg'$ , HDL > 40 mg.   Stress Yes   Intervention Offer individual and/or small group education and counseling on adjustment to heart disease,  stress management and health-related lifestyle change. Teach and support self-help strategies.;Refer participants experiencing significant psychosocial distress to appropriate mental health specialists for further evaluation and treatment. When possible, include family members and significant others in education/counseling sessions.   Expected Outcomes Short Term: Participant demonstrates changes in health-related behavior, relaxation and other stress management skills, ability to obtain effective social support, and compliance with psychotropic medications if prescribed.;Long Term: Emotional wellbeing is indicated by absence of clinically significant psychosocial distress or social isolation.   Personal Goal Other Yes   Personal Goal Healthier lifestyle   Intervention Provide education in exericse and nutrition to improve lifestyle   Expected Outcomes Able to maintain a healthier life long term       Personal Goals Discharge:     Goals and Risk Factor Review    Row Name 02/14/16 0751 03/04/16 0726 04/10/16 0820 05/06/16 1204       Core Components/Risk Factors/Patient Goals Review   Personal Goals Review Increase Strength and Stamina Improve shortness of breath with ADL's;Increase Strength and Stamina Weight Management/Obesity Other    Review increased energy Developing home exercise program and getting a routine (already reviewed  HEP- pt is still trying to find the time)  - Pt is doing HIIT and is doing well. Pt is exercising on off days at home. Pt is holding on to weight gain    Expected Outcomes improved energy/stamina and exercise capacity established exercise routine that will be conducive to work schedule Pt weight is hovering between 205-210. A bit irritated with weightloss. Encouraged exercise and nutrition. Pt understands. Pt will be more proactive and consistent with home exercise routine       Nutrition & Weight - Outcomes:     Pre Biometrics - 01/30/16 1334      Pre Biometrics   Waist Circumference 37 inches   Hip Circumference 38 inches   Waist to Hip Ratio 0.97 %   Triceps Skinfold 15 mm   % Body Fat 25.6 %   Grip Strength 15 kg   Flexibility 16 in   Single Leg Stand 30 seconds         Post Biometrics - 05/16/16 1220       Post  Biometrics   Height 5' 11.75" (1.822 m)   Weight 209 lb 7 oz (95 kg)   Waist Circumference 39 inches   Hip Circumference 41.75 inches   Waist to Hip Ratio 0.93 %   BMI (Calculated) 28.7   Triceps Skinfold 14 mm   % Body Fat 26.2 %   Grip Strength 62.5 kg   Flexibility 15 in   Single Leg Stand 30 seconds      Nutrition:     Nutrition Therapy & Goals - 01/31/16 0820      Nutrition Therapy   Diet Therapeutic Lifestyle Changes     Personal Nutrition Goals   Personal Goal #1 Weight loss of 6-24 lb at graduation from Cardiac Rehab     Intervention Plan   Intervention Prescribe, educate and counsel regarding individualized specific dietary modifications aiming towards targeted core components such as weight, hypertension, lipid management, diabetes, heart failure and other comorbidities.   Expected Outcomes Short Term Goal: Understand basic principles of dietary content, such as calories, fat, sodium, cholesterol and nutrients.;Long Term Goal: Adherence to prescribed nutrition plan.      Nutrition Discharge:     Nutrition Assessments - 05/22/16 0801       MEDFICTS Scores   Pre Score 96   Post  Score --  returned incomplete      Education Questionnaire Score:     Knowledge Questionnaire Score - 05/16/16 1134      Knowledge Questionnaire Score   Post Score 23/24      Goals reviewed with patient Pt graduated from cardiac rehab program today with completion of 36 exercise sessions in Phase II. Pt maintained good attendance to exercise but due to his work schedule he was unable to stay for education classes. Pt  progressed nicely during his participation in rehab as evidenced by increased MET level and addition of high intensity interval training.   Medication list reconciled. Repeat  PHQ score-0 .  Pt has made significant lifestyle changes and should be commended for his success for making healthier choices. Pt feels he made s progress toward  his goals during cardiac rehab  Pt desires to lose weight with a goal weight of 200 lb.  Pt presently hovers at about 206 which is a difference of 2 pounds weight loss from the beginning of his participation.  Pt had expressed interest in vocational rehab.  Pt pursued the orientation for vocational rehab but realized the services were not what he needed at this time.  Pt has returned back to work but is working at another department that is less stressful.   Pt plans to continue exercise at local gym at least 3 times a week. Cherre Huger, BSN

## 2016-08-14 ENCOUNTER — Telehealth (HOSPITAL_COMMUNITY): Payer: Self-pay

## 2016-08-14 NOTE — Telephone Encounter (Signed)
-----   Message from Othella BoyerWilliam S Tilley, MD sent at 04/08/2016  6:31 PM EDT ----- Regarding: RE: HIIT- high intensity interval training I am agreeable to this.  Darden PalmerW. Spencer Tilley, Jr. MD [facility] CenterFACC  ----- Message -----    From: Delsa GranaAmber D Kessa Fairbairn    Sent: 04/08/2016   5:08 PM      To: Othella BoyerWilliam S Tilley, MD Subject: HIIT- high intensity interval training         Your patient Adam Taylor is interested in doing high intensity interval training (HIIT) in Cardiac Rehab.  They have been in program for 8 weeks and have been doing great.  We would like to change their exercise prescription to include HIIT.  Their current THR is 68-135 (40-80%) and we would like to increase it to 160 max (95 %) for HIIT.  Their RPE levels for the HIIT would reach up to 16-17 and active rest would be 11-13.  They would start at 2 min of active rest and 30 sec of high intensity and progress as tolerated. If you are agreeable to this change in exercise prescription please let us know.   Thanks so much for your help!    Chava Dulac [employer],MS,ACSM RCEP

## 2017-05-09 IMAGING — CR DG CHEST 1V PORT
1 series · 1 of 1 positions shown · non-contrast
Comparison: Prior chest x-ray 12/15/2015

CLINICAL DATA: 49-year-old male status post 3 vessel CABG

EXAM:
PORTABLE CHEST 1 VIEW

[AP]
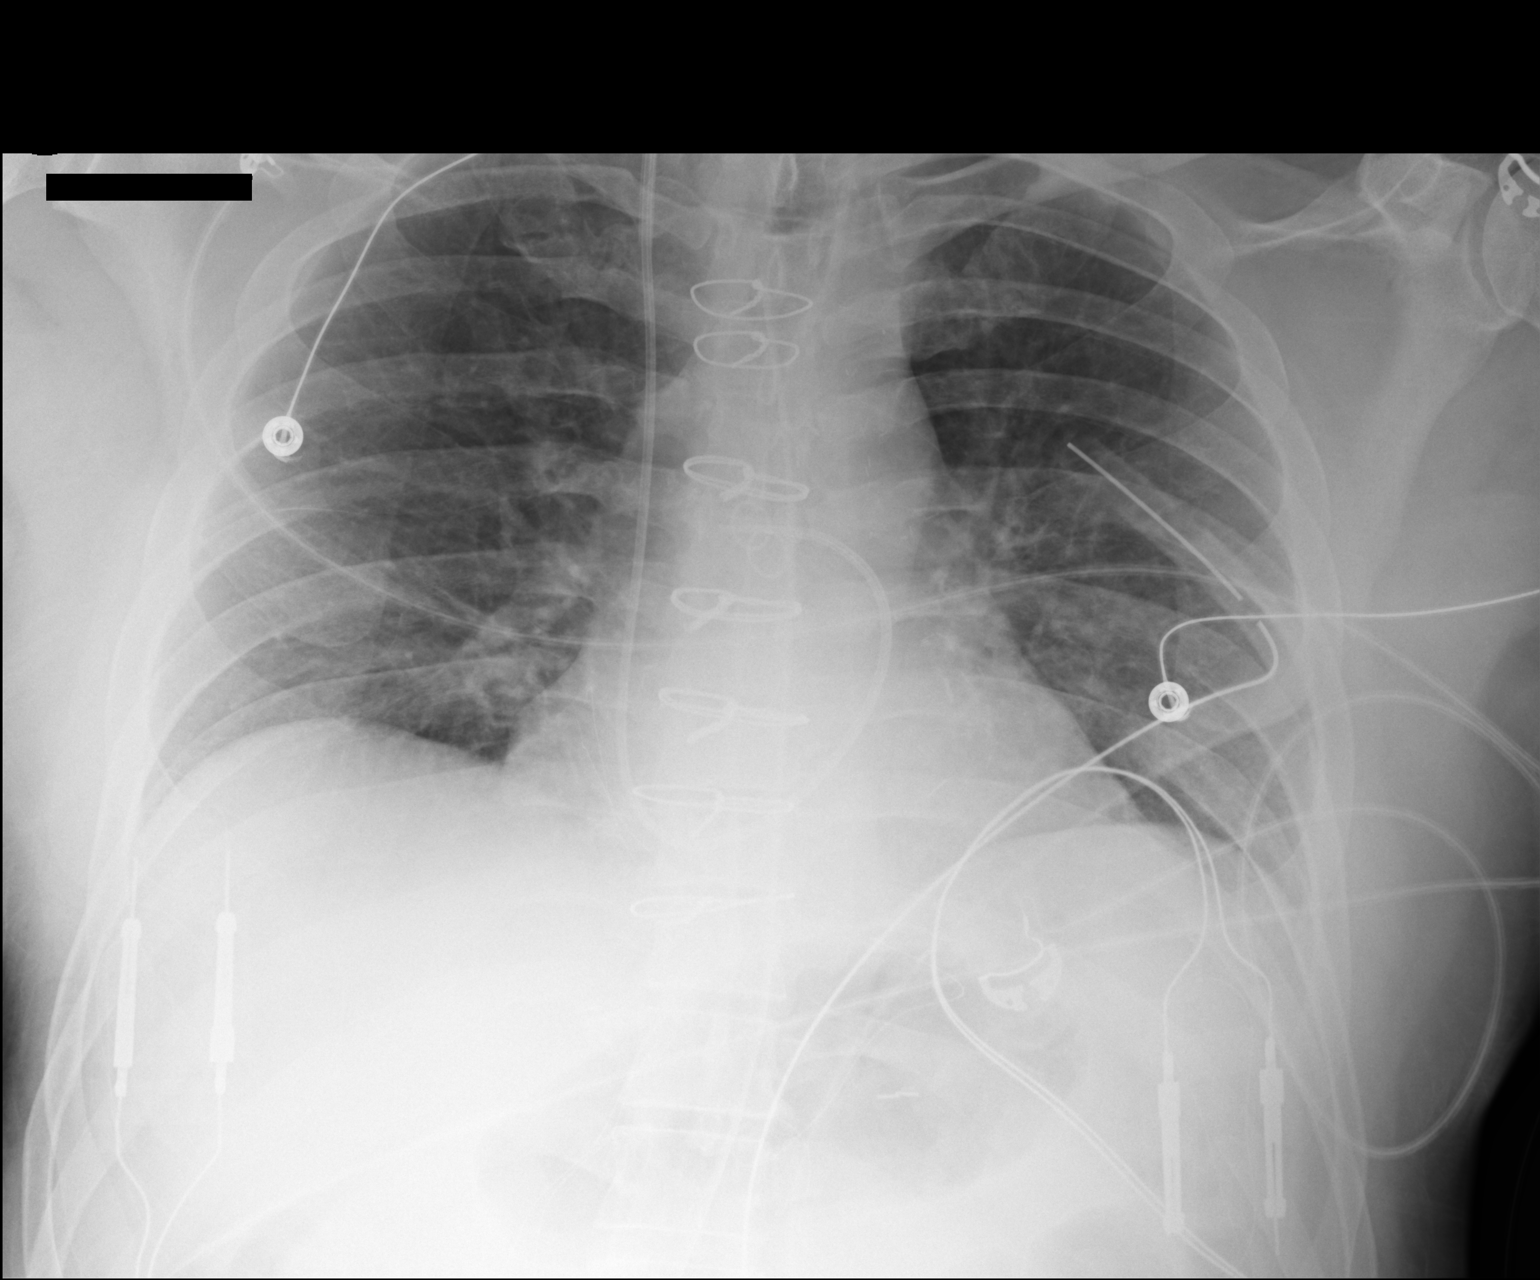

[1 of 1 positions shown; findings below may reference images not displayed]

FINDINGS: A right IJ vascular sheath convey is a Swan-Ganz catheter into the
heart. The tip of the Krysta overlies the right main pulmonary artery.
Left chest tube in place. No evidence of pneumothorax. Patient is
status post median sternotomy with evidence of multivessel CABG. No
pulmonary edema, pleural effusion or pneumothorax. Slightly low
inspiratory volumes with minimal bibasilar atelectasis. No acute
osseous abnormality.
IMPRESSION: 1. The tip of a right IJ approach Krysta overlies the right main
pulmonary artery.
2. Well-positioned left thoracostomy tube. No evidence of
pneumothorax.
3. Slightly low inspiratory volumes with minimal bibasilar
atelectasis.

## 2017-05-10 IMAGING — CR DG CHEST 1V PORT
1 series · 1 of 1 positions shown · non-contrast
Comparison: Chest radiograph from one day prior.

CLINICAL DATA: CABG.

EXAM:
PORTABLE CHEST 1 VIEW

[AP]
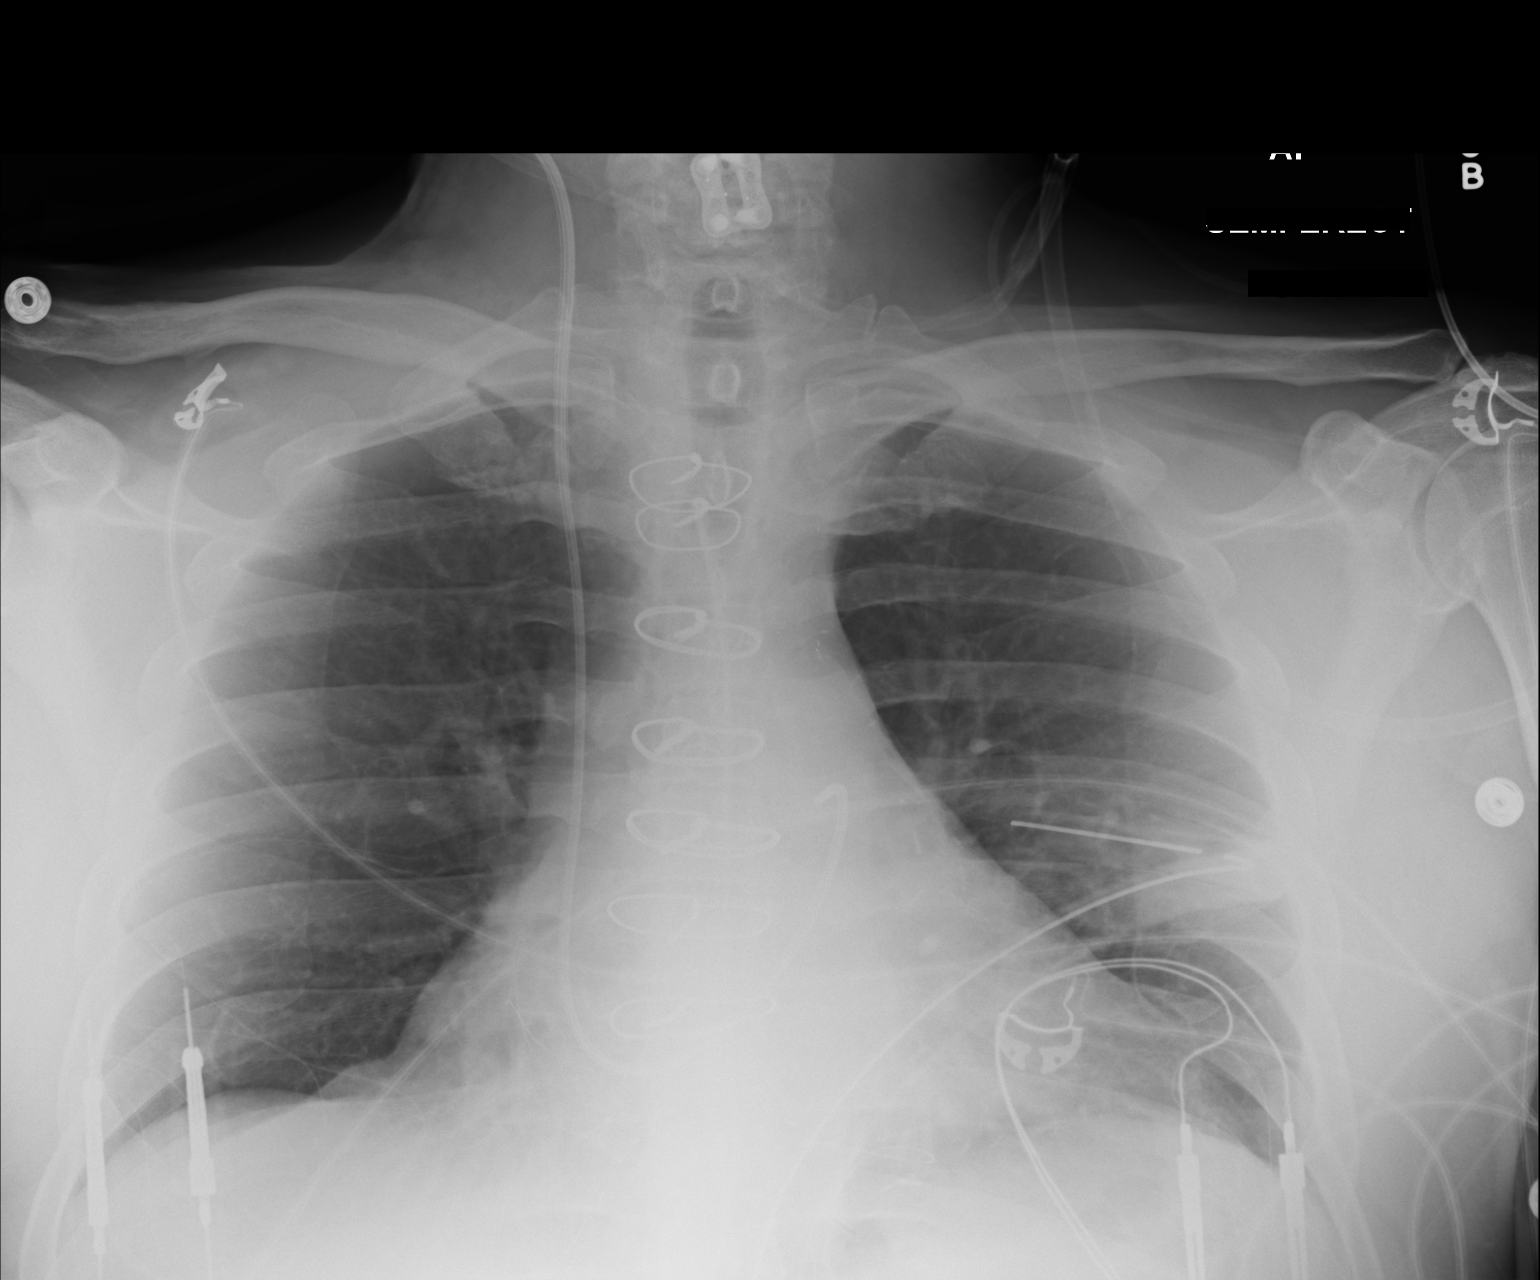

[1 of 1 positions shown; findings below may reference images not displayed]

FINDINGS: Right internal jugular Swan-Ganz catheter terminates over the main
pulmonary artery. Sternotomy wires appear aligned and intact. Left
chest tube and mediastinal drain are in place. Surgical plate with
interlocking screws overlies the lower cervical spine. Stable
cardiomediastinal silhouette with top-normal heart size. No
pneumothorax. No pleural effusion. No pulmonary edema. No acute
consolidative airspace disease. Mild left basilar atelectasis.
IMPRESSION: 1. Support hardware as described.
2. Slightly increased mild left basilar atelectasis. No
pneumothorax. No pulmonary edema.

## 2017-05-11 IMAGING — CR DG CHEST 1V PORT
1 series · 1 of 1 positions shown · non-contrast
Comparison: 12/17/2015.

CLINICAL DATA: 49-year-old male post CABG.  Subsequent encounter.

EXAM:
PORTABLE CHEST 1 VIEW

[AP]
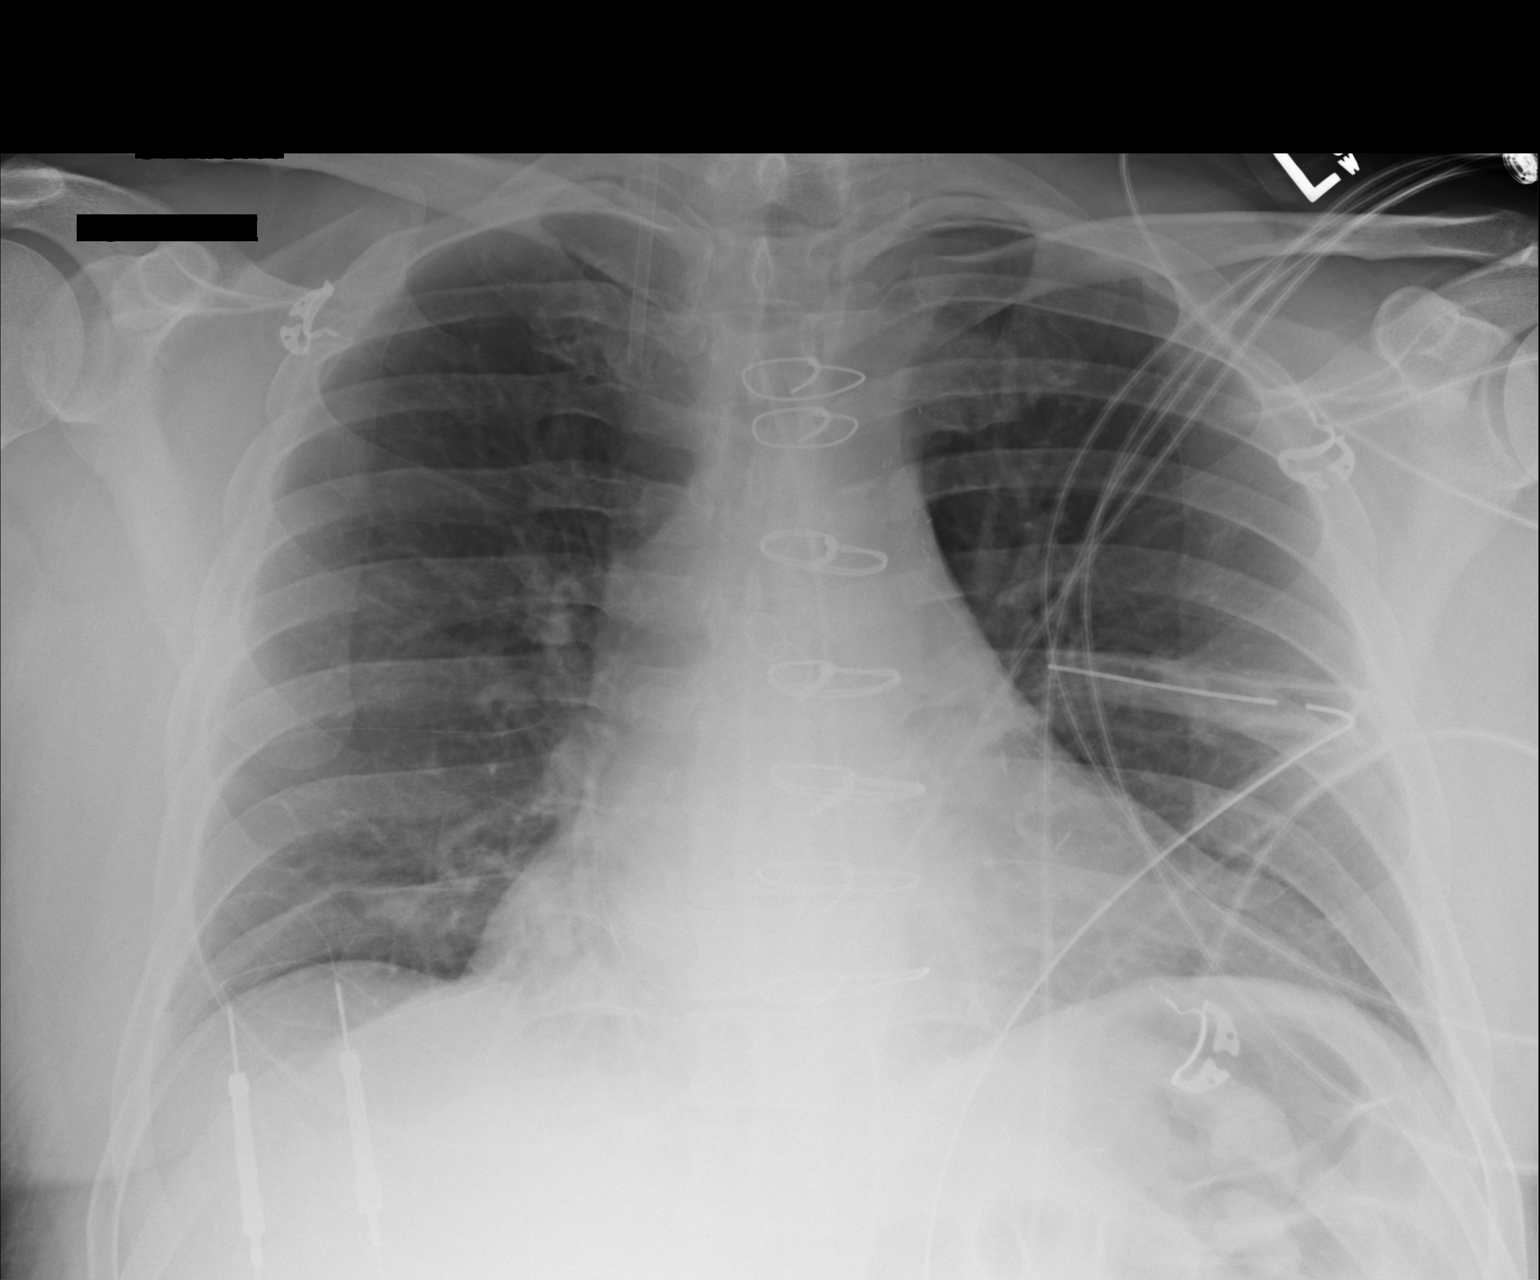

[1 of 1 positions shown; findings below may reference images not displayed]

FINDINGS: Right Swan-Ganz catheter has been removed. Introducer tip projects
at the level of the right internal jugular vein/ brachiocephalic
vein junction.

Left-sided chest tube remains in place with tiny left apical
pneumothorax.

Mild bibasilar subsegmental atelectasis.

Post CABG.  Cardiomegaly.

Prior cervical spine surgery.

Erosion distal right clavicle with slight elevation.
IMPRESSION: Left-sided chest tube remains in place with tiny left apical
pneumothorax.

Post CABG.  Cardiomegaly.

Bibasilar subsegmental atelectasis.

Swan-Ganz catheter has been removed.

## 2017-06-10 IMAGING — CR DG CHEST 2V
2 series · 2 of 2 positions shown · non-contrast
Comparison: Portable chest x-ray December 19, 2015.

CLINICAL DATA: Status post CABG on December 16, 2015; no current
chest complaints.

EXAM:
CHEST  2 VIEW

[w chest pa]
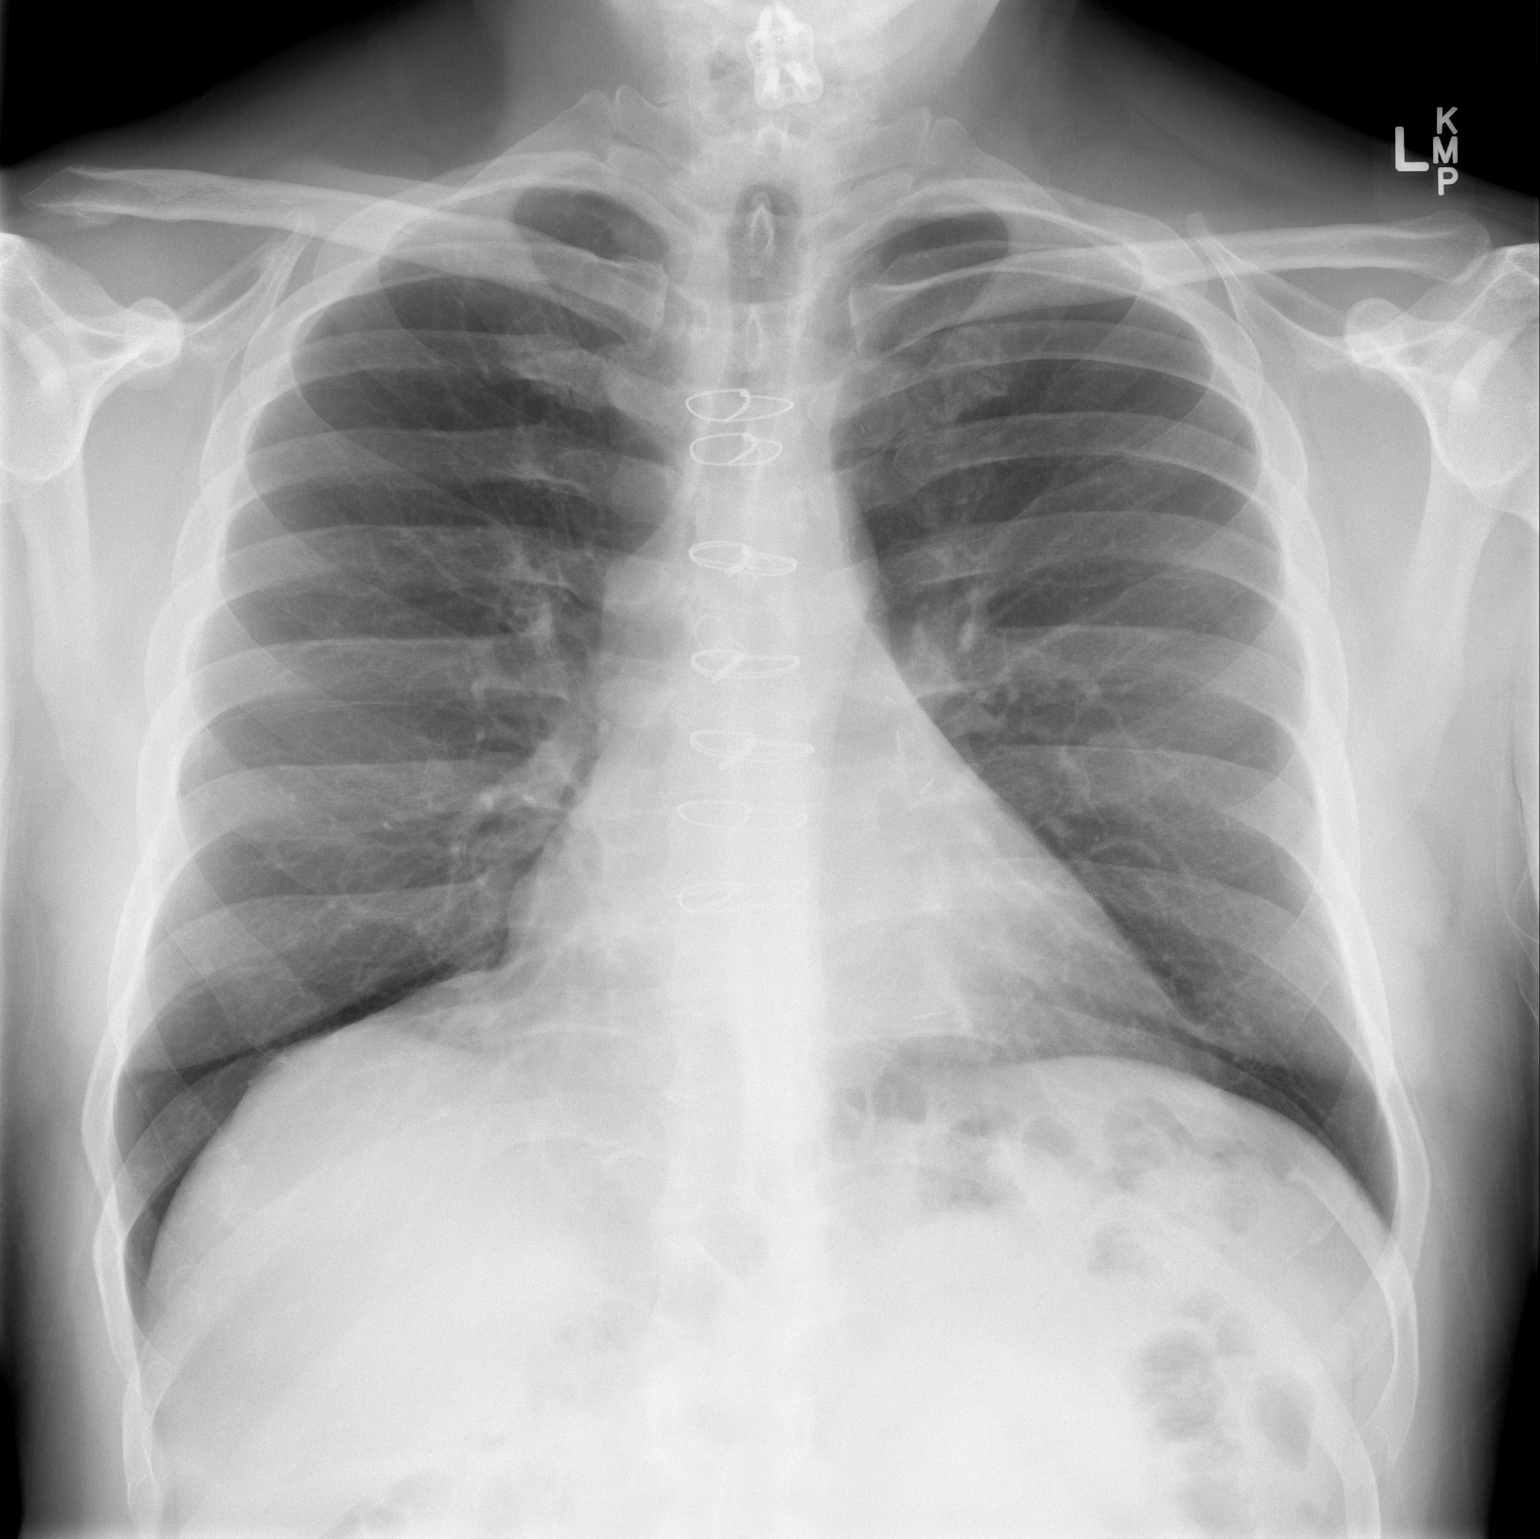

[w chest lat]
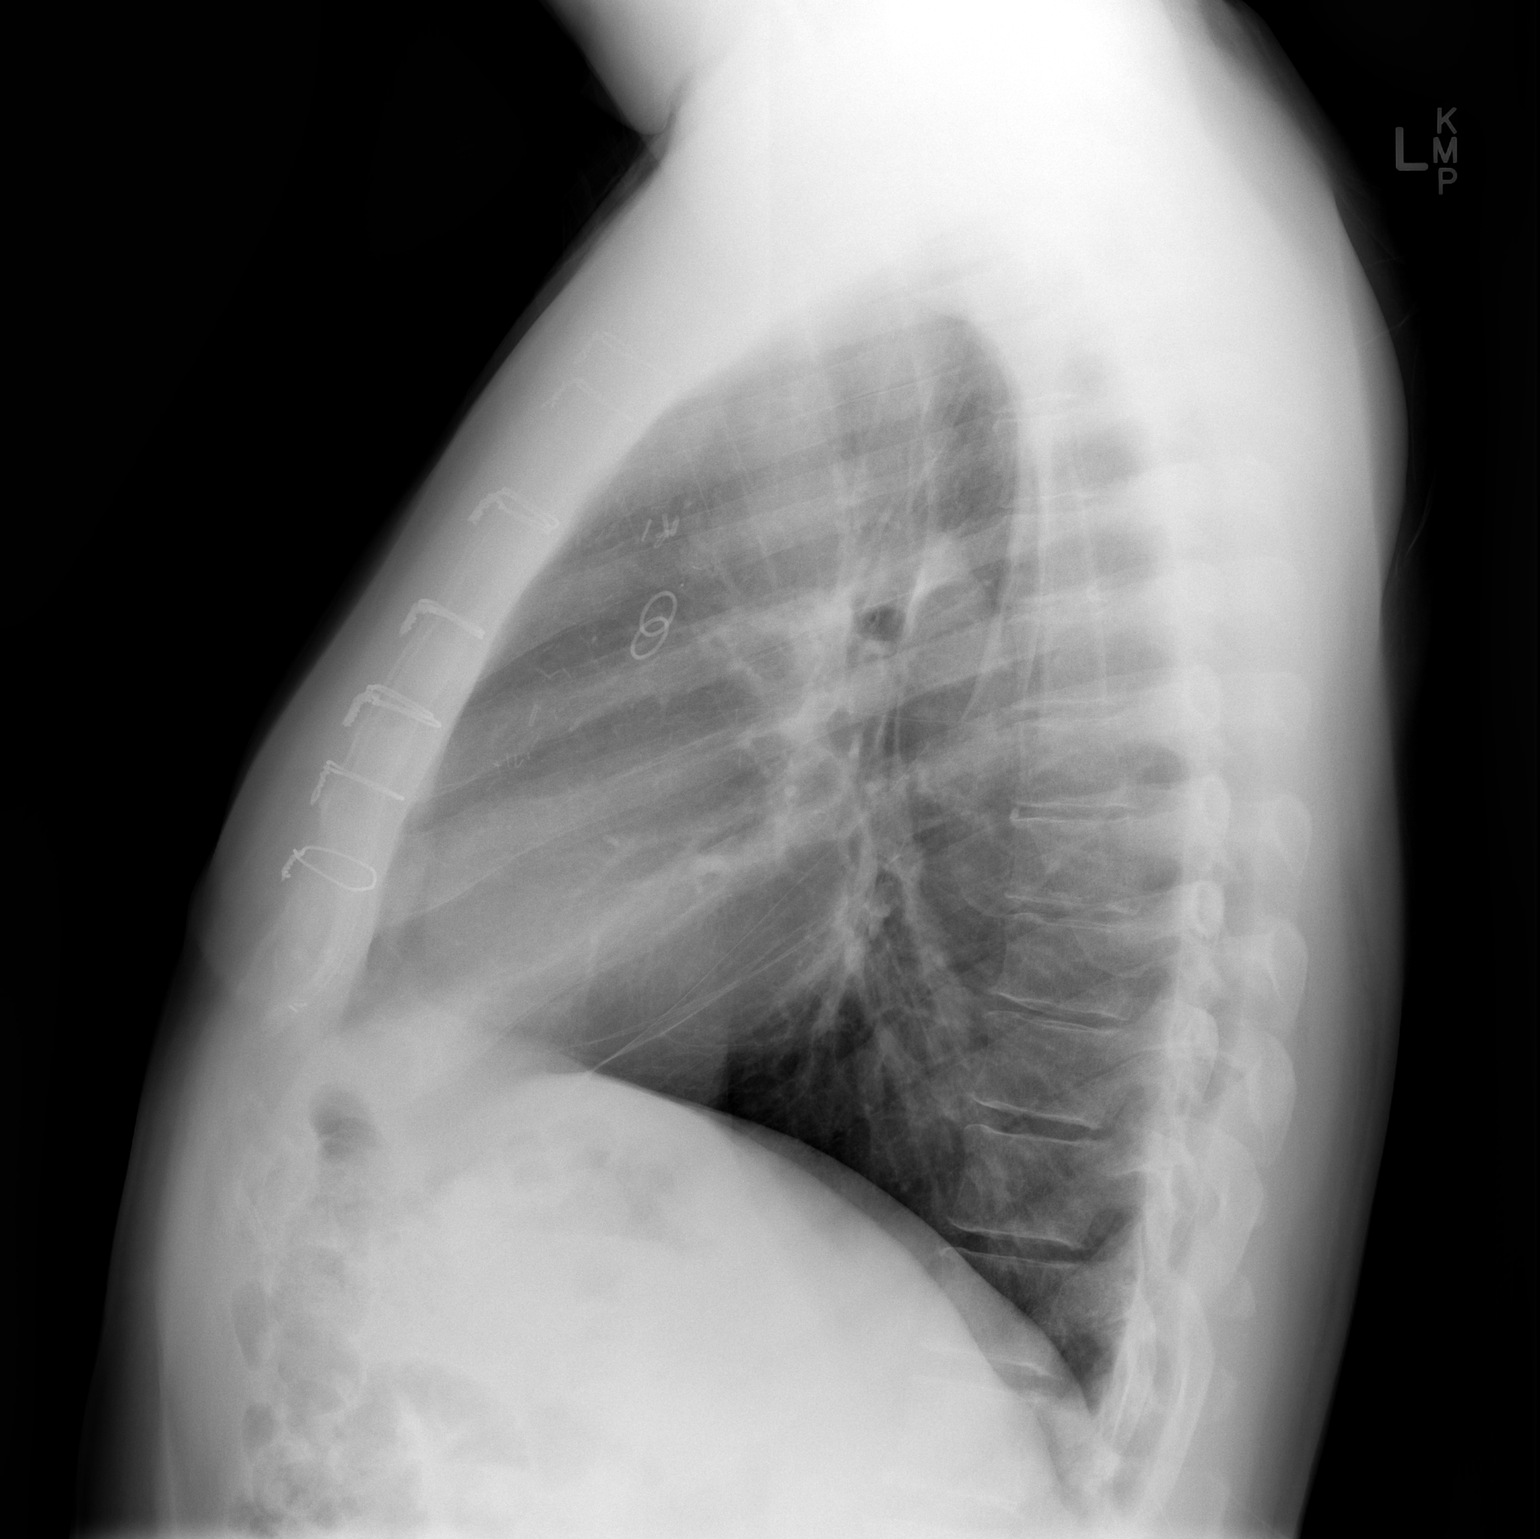

[2 of 2 positions shown; findings below may reference images not displayed]

FINDINGS: The lungs are well-expanded and clear. There is no pleural effusion
or pneumothorax. The heart and pulmonary vascularity are normal. The
mediastinum is normal in width. The sternal wires are intact. The
retrosternal soft tissues are normal. The thoracic vertebral bodies
are preserved in height.
IMPRESSION: There is no active cardiopulmonary disease. The tiny left apical
pneumothorax has resolved.
